# Patient Record
Sex: Female | Born: 1980 | Race: White | Hispanic: No | Marital: Single | Smoking: Never smoker
Health system: Southern US, Community
[De-identification: ages and names within clinical notes are randomized; demographics above are authoritative.]

## PROBLEM LIST (undated history)

## (undated) DIAGNOSIS — N189 Chronic kidney disease, unspecified: Secondary | ICD-10-CM

## (undated) DIAGNOSIS — N1832 Chronic kidney disease, stage 3b: Secondary | ICD-10-CM

## (undated) DIAGNOSIS — D582 Other hemoglobinopathies: Secondary | ICD-10-CM

## (undated) DIAGNOSIS — H2010 Chronic iridocyclitis, unspecified eye: Secondary | ICD-10-CM

## (undated) DIAGNOSIS — D751 Secondary polycythemia: Secondary | ICD-10-CM

## (undated) DIAGNOSIS — Q909 Down syndrome, unspecified: Secondary | ICD-10-CM

## (undated) DIAGNOSIS — E039 Hypothyroidism, unspecified: Secondary | ICD-10-CM

## (undated) DIAGNOSIS — L680 Hirsutism: Secondary | ICD-10-CM

## (undated) HISTORY — PX: ATRIOVENTRICULAR CANAL REPAIR, COMPLETE: SHX1200

## (undated) HISTORY — DX: Chronic iridocyclitis, unspecified eye: H20.10

## (undated) HISTORY — PX: CARDIAC SURGERY: SHX584

## (undated) HISTORY — DX: Chronic kidney disease, stage 3b: N18.32

## (undated) HISTORY — DX: Other hemoglobinopathies: D58.2

---

## 2021-03-06 ENCOUNTER — Other Ambulatory Visit: Payer: Self-pay | Admitting: Nephrology

## 2021-03-06 ENCOUNTER — Other Ambulatory Visit (HOSPITAL_COMMUNITY): Payer: Self-pay | Admitting: Nephrology

## 2021-03-06 DIAGNOSIS — N1832 Chronic kidney disease, stage 3b: Secondary | ICD-10-CM

## 2021-03-15 ENCOUNTER — Other Ambulatory Visit: Payer: Self-pay

## 2021-03-15 ENCOUNTER — Ambulatory Visit
Admission: RE | Admit: 2021-03-15 | Discharge: 2021-03-15 | Disposition: A | Discharge status: Home / Self Care | Payer: Medicare HMO | Source: Ambulatory Visit | Attending: Nephrology | Admitting: Nephrology

## 2021-03-15 DIAGNOSIS — N1832 Chronic kidney disease, stage 3b: Secondary | ICD-10-CM | POA: Diagnosis not present

## 2021-03-21 ENCOUNTER — Other Ambulatory Visit: Payer: Self-pay

## 2021-03-21 ENCOUNTER — Emergency Department
Admission: EM | Admit: 2021-03-21 | Discharge: 2021-03-21 | Disposition: A | Discharge status: Home / Self Care | Payer: Medicare HMO | Attending: Emergency Medicine | Admitting: Emergency Medicine

## 2021-03-21 ENCOUNTER — Encounter: Payer: Self-pay | Admitting: Emergency Medicine

## 2021-03-21 DIAGNOSIS — R319 Hematuria, unspecified: Secondary | ICD-10-CM | POA: Insufficient documentation

## 2021-03-21 DIAGNOSIS — R531 Weakness: Secondary | ICD-10-CM | POA: Diagnosis not present

## 2021-03-21 HISTORY — DX: Secondary polycythemia: D75.1

## 2021-03-21 HISTORY — DX: Chronic kidney disease, unspecified: N18.9

## 2021-03-21 HISTORY — DX: Hypothyroidism, unspecified: E03.9

## 2021-03-21 HISTORY — DX: Down syndrome, unspecified: Q90.9

## 2021-03-21 HISTORY — DX: Hirsutism: L68.0

## 2021-03-21 LAB — URINALYSIS, COMPLETE (UACMP) WITH MICROSCOPIC
Bacteria, UA: NONE SEEN
Bilirubin Urine: NEGATIVE
Glucose, UA: NEGATIVE mg/dL
Hgb urine dipstick: NEGATIVE
Ketones, ur: NEGATIVE mg/dL
Leukocytes,Ua: NEGATIVE
Nitrite: NEGATIVE
Protein, ur: NEGATIVE mg/dL
Specific Gravity, Urine: 1.002 — ABNORMAL LOW (ref 1.005–1.030)
WBC, UA: NONE SEEN WBC/hpf (ref 0–5)
pH: 6 (ref 5.0–8.0)

## 2021-03-21 LAB — COMPREHENSIVE METABOLIC PANEL
ALT: 18 U/L (ref 0–44)
AST: 30 U/L (ref 15–41)
Albumin: 3.1 g/dL — ABNORMAL LOW (ref 3.5–5.0)
Alkaline Phosphatase: 55 U/L (ref 38–126)
Anion gap: 11 (ref 5–15)
BUN: 17 mg/dL (ref 6–20)
CO2: 22 mmol/L (ref 22–32)
Calcium: 8.7 mg/dL — ABNORMAL LOW (ref 8.9–10.3)
Chloride: 99 mmol/L (ref 98–111)
Creatinine, Ser: 1.47 mg/dL — ABNORMAL HIGH (ref 0.44–1.00)
GFR, Estimated: 46 mL/min — ABNORMAL LOW (ref >60)
Glucose, Bld: 86 mg/dL (ref 70–99)
Potassium: 3.9 mmol/L (ref 3.5–5.1)
Sodium: 132 mmol/L — ABNORMAL LOW (ref 135–145)
Total Bilirubin: 1.1 mg/dL (ref 0.3–1.2)
Total Protein: 7.8 g/dL (ref 6.5–8.1)

## 2021-03-21 LAB — CBC
HCT: 45.8 % (ref 36.0–46.0)
Hemoglobin: 15.3 g/dL — ABNORMAL HIGH (ref 12.0–15.0)
MCH: 33.1 pg (ref 26.0–34.0)
MCHC: 33.4 g/dL (ref 30.0–36.0)
MCV: 99.1 fL (ref 80.0–100.0)
Platelets: 114 10*3/uL — ABNORMAL LOW (ref 150–400)
RBC: 4.62 MIL/uL (ref 3.87–5.11)
RDW: 13.1 % (ref 11.5–15.5)
WBC: 4.5 10*3/uL (ref 4.0–10.5)
nRBC: 0 % (ref 0.0–0.2)

## 2021-03-21 LAB — T4, FREE: Free T4: 1.64 ng/dL — ABNORMAL HIGH (ref 0.61–1.12)

## 2021-03-21 LAB — TSH: TSH: 14.066 u[IU]/mL — ABNORMAL HIGH (ref 0.350–4.500)

## 2021-03-21 MED ORDER — SODIUM CHLORIDE 0.9 % IV BOLUS
500.0000 mL | Freq: Once | INTRAVENOUS | Status: DC
Start: 2021-03-21 — End: 2021-03-22

## 2021-03-21 NOTE — ED Provider Notes (Signed)
? ?[facility] ?Provider Note ? ? ? Event Date/Time  ? First MD Initiated Contact with Patient 03/21/21 1708   ?  (approximate) ? ? ?History  ? ?Hematuria and Abnormal Lab ? ? ?HPI ? ?Sarah Barker is a 41 y.o. female  who, per nephrology noted dated earlier today was sent to the emergency department today because of concern for "the relative hypotension and out of character behavior we will refer the patient over to the emergency department for further evaluation as she had an unstable gait."  History is obtained from family at bedside. They state that the patient started having issues roughly 6 weeks ago. They have noticed she has had decreased energy and difficulty with gait. Per chart review it was noted on nephrology note dated 02/21/2021 she had already had multiple falls. Family is also concerned because they have noted some skin changes to her shins and forearms over the past month. Family does not have any acute complaints.  ? ? ?Physical Exam  ? ?Triage Vital Signs: ?ED Triage Vitals  ?Enc Vitals Group  ?   BP 03/21/21 1548 112/77  ?   Pulse Rate 03/21/21 1548 (!) 102  ?   Resp 03/21/21 1548 17  ?   Temp 03/21/21 1548 98.3 ?F (36.8 ?C)  ?   Temp Source 03/21/21 1548 Oral  ?   SpO2 03/21/21 1548 100 %  ?   Weight 03/21/21 1548 142 lb (64.4 kg)  ?   Height 03/21/21 1548 4\' 9"  (1.448 m)  ? ?Most recent vital signs: ?Vitals:  ? 03/21/21 1548  ?BP: 112/77  ?Pulse: (!) 102  ?Resp: 17  ?Temp: 98.3 ?F (36.8 ?C)  ?SpO2: 100%  ? ? ?General: Awake, no distress.  ?CV:  Good peripheral perfusion.  ?Resp:  Normal effort.  ?Abd:  No distention.  ?Skin:  Skin changes noted to bilateral shins. No warmth.  ? ? ?ED Results / Procedures / Treatments  ? ?Labs ?(all labs ordered are listed, but only abnormal results are displayed) ?Labs Reviewed  ?CBC - Abnormal; Notable for the following components:  ?    Result Value  ? Hemoglobin 15.3 (*)   ? Platelets 114 (*)   ? All other components within normal  limits  ?URINALYSIS, COMPLETE (UACMP) WITH MICROSCOPIC - Abnormal; Notable for the following components:  ? Color, Urine STRAW (*)   ? APPearance CLEAR (*)   ? Specific Gravity, Urine 1.002 (*)   ? All other components within normal limits  ?COMPREHENSIVE METABOLIC PANEL - Abnormal; Notable for the following components:  ? Sodium 132 (*)   ? Creatinine, Ser 1.47 (*)   ? Calcium 8.7 (*)   ? Albumin 3.1 (*)   ? GFR, Estimated 46 (*)   ? All other components within normal limits  ?TSH - Abnormal; Notable for the following components:  ? TSH 14.066 (*)   ? All other components within normal limits  ?T4, FREE - Abnormal; Notable for the following components:  ? Free T4 1.64 (*)   ? All other components within normal limits  ? ? ? ?EKG ? ?None ? ?RADIOLOGY ?None ? ? ?PROCEDURES: ? ?Critical Care performed: No ? ?Procedures ? ? ?MEDICATIONS ORDERED IN ED: ?Medications - No data to display ? ? ?IMPRESSION / MDM / ASSESSMENT AND PLAN / ED COURSE  ?I reviewed the triage vital signs and the nursing notes. ?             ?               ? ?  Differential diagnosis includes, but is not limited to, dehydration, thyroid disease, infection. ? ?Patient presents to the emergency department today at the advice of nephrology because of concerns for low blood pressure and weakness.  It does sound like this weakness has been ongoing for the past 6 weeks.  Blood work today without any concerning leukocytosis.  Patient does have a slight elevation of her creatinine however already follows with nephrology.  Did write for IV fluids however family opted for oral hydration instead.  I did check thyroid studies and a somewhat mixed picture with elevated T4 and elevated TSH.  I do however wonder if patient is somewhat hypothyroid given that many of her symptoms would fit with this.  I did advise patient's family to contact her endocrinologist for possible medication adjustment.   ? ? ?FINAL CLINICAL IMPRESSION(S) / ED DIAGNOSES  ? ?Final diagnoses:   ?Weakness  ? ? ?Note:  This document was prepared using Dragon voice recognition software and may include unintentional dictation errors. ? ?  ?Phineas Semen, MD ?03/21/21 2300 ? ?

## 2021-03-21 NOTE — ED Triage Notes (Signed)
Pt via POV from home. Pt here for kidney issues. Per family, pt has been seeing a kidney doctor since January. Per family, pt health has been decreasing for the past 6 months.  ?Pt has a hx of Down Syndrome.  ?

## 2021-03-21 NOTE — Discharge Instructions (Signed)
Please seek medical attention for any high fevers, chest pain, shortness of breath, change in behavior, persistent vomiting, bloody stool or any other new or concerning symptoms.  

## 2021-03-21 NOTE — ED Notes (Signed)
Family knows need for ua  ? ?

## 2021-03-21 NOTE — ED Notes (Signed)
Mother gave verbal consent for DC ? ?

## 2021-03-21 NOTE — ED Notes (Signed)
Mother asked if pt could eat and drink instead. Confirmed with Derrill Kay to hold off on IV fluids  ? ?

## 2021-03-21 NOTE — ED Notes (Signed)
According to mother pt has no been acting herself for about 6 weeks. Mother states they were sent here today due to low BP and hemoglobin being too high ?  ?

## 2021-04-03 ENCOUNTER — Encounter: Payer: Self-pay | Admitting: *Deleted

## 2021-04-15 ENCOUNTER — Inpatient Hospital Stay: Payer: Medicare HMO

## 2021-04-15 ENCOUNTER — Inpatient Hospital Stay: Payer: Medicare HMO | Attending: Oncology | Admitting: Oncology

## 2021-04-15 ENCOUNTER — Encounter: Payer: Self-pay | Admitting: Oncology

## 2021-04-15 VITALS — BP 106/72 | HR 102 | Temp 96.4°F | Ht <= 58 in | Wt 140.0 lb

## 2021-04-15 DIAGNOSIS — D751 Secondary polycythemia: Secondary | ICD-10-CM

## 2021-04-15 DIAGNOSIS — N189 Chronic kidney disease, unspecified: Secondary | ICD-10-CM | POA: Insufficient documentation

## 2021-04-15 DIAGNOSIS — D696 Thrombocytopenia, unspecified: Secondary | ICD-10-CM

## 2021-04-15 DIAGNOSIS — Q909 Down syndrome, unspecified: Secondary | ICD-10-CM | POA: Diagnosis not present

## 2021-04-15 DIAGNOSIS — R229 Localized swelling, mass and lump, unspecified: Secondary | ICD-10-CM

## 2021-04-15 LAB — CBC WITH DIFFERENTIAL/PLATELET
Abs Immature Granulocytes: 0.01 K/uL (ref 0.00–0.07)
Basophils Absolute: 0.1 K/uL (ref 0.0–0.1)
Basophils Relative: 1 %
Eosinophils Absolute: 0.1 K/uL (ref 0.0–0.5)
Eosinophils Relative: 2 %
HCT: 46 % (ref 36.0–46.0)
Hemoglobin: 15.5 g/dL — ABNORMAL HIGH (ref 12.0–15.0)
Immature Granulocytes: 0 %
Lymphocytes Relative: 19 %
Lymphs Abs: 0.8 K/uL (ref 0.7–4.0)
MCH: 34 pg (ref 26.0–34.0)
MCHC: 33.7 g/dL (ref 30.0–36.0)
MCV: 100.9 fL — ABNORMAL HIGH (ref 80.0–100.0)
Monocytes Absolute: 0.6 K/uL (ref 0.1–1.0)
Monocytes Relative: 13 %
Neutro Abs: 2.7 K/uL (ref 1.7–7.7)
Neutrophils Relative %: 65 %
Platelets: 142 K/uL — ABNORMAL LOW (ref 150–400)
RBC: 4.56 MIL/uL (ref 3.87–5.11)
RDW: 13.9 % (ref 11.5–15.5)
WBC: 4.2 K/uL (ref 4.0–10.5)
nRBC: 0 % (ref 0.0–0.2)

## 2021-04-15 LAB — LACTATE DEHYDROGENASE: LDH: 137 U/L (ref 98–192)

## 2021-04-15 NOTE — Progress Notes (Signed)
?Hematology/Oncology Consult note ?[phone] [phone] ?  ? ?   ? ? ?Patient Care Team: ?Anthonette Legato, MD as PCP - General (Nephrology) ?Earlie Server, MD as Consulting Physician (Hematology) ? ?REFERRING PROVIDER: ?Anthonette Legato, MD  ?CHIEF COMPLAINTS/REASON FOR VISIT:  ?Evaluation of elevated hemoglobin ? ?HISTORY OF PRESENTING ILLNESS:  ? ?Sarah Barker is a  41 y.o.  female with PMH listed below was seen in consultation at the request of  Holley Raring, Munsoor, MD  for evaluation of elevated hemoglobin ? ?Patient was accompanied by her mother.  Patient has Down syndrome. ?Mother has noticed decreased appetite recently and also erythematous rash on bilateral upper and lower extremities.  Patient has chronic kidney disease and follows up with Dr. Holley Raring. ?Nephrology work-up negative for ANA, ANCA antibodies, and GBM antibodies, SPEP and UPEP ? ? ?Review of Systems  ?Unable to perform ROS: Other (Down syndrome)  ? ?MEDICAL HISTORY:  ?Past Medical History:  ?Diagnosis Date  ? CKD (chronic kidney disease)   ? Down syndrome   ? Elevated hemoglobin (HCC)   ? Erythrocytosis   ? Erythrocytosis   ? Granulomatous uveitis   ? Hirsutism   ? Hypothyroid   ? Stage 3b chronic kidney disease (CKD) ([place])   ? ? ?SURGICAL HISTORY: ?Past Surgical History:  ?Procedure Laterality Date  ? ATRIOVENTRICULAR CANAL REPAIR, COMPLETE    ? CARDIAC SURGERY    ? ? ?SOCIAL HISTORY: ?Social History  ? ?Socioeconomic History  ? Marital status: Single  ?  Spouse name: Not on file  ? Number of children: Not on file  ? Years of education: Not on file  ? Highest education level: Not on file  ?Occupational History  ? Not on file  ?Tobacco Use  ? Smoking status: Never  ? Smokeless tobacco: Never  ?Substance and Sexual Activity  ? Alcohol use: Not on file  ? Drug use: Not on file  ? Sexual activity: Not on file  ?Other Topics Concern  ? Not on file  ?Social History Narrative  ? Not on file  ? ?Social Determinants of Health   ? ?Financial Resource Strain: Not on file  ?Food Insecurity: Not on file  ?Transportation Needs: Not on file  ?Physical Activity: Not on file  ?Stress: Not on file  ?Social Connections: Not on file  ?Intimate Partner Violence: Not on file  ? ? ?FAMILY HISTORY: ?History reviewed. No pertinent family history. ? ?ALLERGIES:  has No Known Allergies. ? ?MEDICATIONS:  ?Current Outpatient Medications  ?Medication Sig Dispense Refill  ? levothyroxine (SYNTHROID) 88 MCG tablet Take by mouth.    ? prednisoLONE acetate (PRED FORTE) 1 % ophthalmic suspension Apply to eye.    ? valACYclovir (VALTREX) 1000 MG tablet Take by mouth.    ? ?No current facility-administered medications for this visit.  ? ? ? ?PHYSICAL EXAMINATION: ? ?Vitals:  ? 04/15/21 1501  ?BP: 106/72  ?Pulse: (!) 102  ?Temp: (!) 96.4 ?F (35.8 ?C)  ? ?Filed Weights  ? 04/15/21 1501  ?Weight: 140 lb (63.5 kg)  ? ? ?Physical Exam ?Constitutional:   ?   General: She is not in acute distress. ?HENT:  ?   Head: Normocephalic and atraumatic.  ?Eyes:  ?   General: No scleral icterus. ?Cardiovascular:  ?   Rate and Rhythm: Normal rate and regular rhythm.  ?   Heart sounds: Normal heart sounds.  ?Pulmonary:  ?   Effort: Pulmonary effort is normal. No respiratory distress.  ?   Breath sounds: No  wheezing.  ?Abdominal:  ?   General: Bowel sounds are normal. There is no distension.  ?   Palpations: Abdomen is soft.  ?Musculoskeletal:     ?   General: No deformity. Normal range of motion.  ?   Cervical back: Normal range of motion and neck supple.  ?Skin: ?   General: Skin is warm and dry.  ?   Comments: Erythematous skin nodules on upper and lower extremities.  ?Neurological:  ?   Mental Status: She is alert. Mental status is at baseline.  ? ? ?LABORATORY DATA:  ?I have reviewed the data as listed ?Lab Results  ?Component Value Date  ? WBC 4.2 04/15/2021  ? HGB 15.5 (H) 04/15/2021  ? HCT 46.0 04/15/2021  ? MCV 100.9 (H) 04/15/2021  ? PLT 142 (L) 04/15/2021  ? ?Recent Labs  ?   03/21/21 ?1552  ?NA 132*  ?K 3.9  ?CL 99  ?CO2 22  ?GLUCOSE 86  ?BUN 17  ?CREATININE 1.47*  ?CALCIUM 8.7*  ?GFRNONAA 46*  ?PROT 7.8  ?ALBUMIN 3.1*  ?AST 30  ?ALT 18  ?ALKPHOS 55  ?BILITOT 1.1  ? ?Iron/TIBC/Ferritin/ %Sat ?No results found for: IRON, TIBC, FERRITIN, IRONPCTSAT  ? ? ?RADIOGRAPHIC STUDIES: ?I have personally reviewed the radiological images as listed and agreed with the findings in the report. ?No results found. ? ? ? ?ASSESSMENT & PLAN:  ?1. Erythrocytosis   ?2. Skin nodule   ? ?Check CBC, BCR ABL1 FISH,Carbon monoxide level, Erythropoietin, LDH ?If work-up for primary erythrocytosis negative, patient will need to work-up for secondary erythrocytosis, i.e.Sleep study ? ?Thrombocytopenia, mild. ? ?Skin nodules on extremities.  Unknown etiology.He has had extensive work-up with Nephrologist ?I recommend patient to get dermatology evaluation and have a biopsy. ? ?Orders Placed This Encounter  ?Procedures  ? CBC with Differential/Platelet  ?  Standing Status:   Future  ?  Number of Occurrences:   1  ?  Standing Expiration Date:   04/16/2022  ? JAK2 V617F, w Reflex to CALR/E12/MPL  ?  Standing Status:   Future  ?  Number of Occurrences:   1  ?  Standing Expiration Date:   04/16/2022  ? BCR-ABL1 FISH  ?  Standing Status:   Future  ?  Number of Occurrences:   1  ?  Standing Expiration Date:   04/16/2022  ? Carbon monoxide, blood (performed at ref lab)  ?  Standing Status:   Future  ?  Number of Occurrences:   1  ?  Standing Expiration Date:   04/16/2022  ? Erythropoietin  ?  Standing Status:   Future  ?  Number of Occurrences:   1  ?  Standing Expiration Date:   04/16/2022  ? Lactate dehydrogenase  ?  Standing Status:   Future  ?  Number of Occurrences:   1  ?  Standing Expiration Date:   04/16/2022  ?  ?All questions were answered. The patient knows to call the clinic with any problems questions or concerns. ? ?cc ?Anthonette Legato, MD  ? ? ?Return of visit: 3-4 weeks to review results.  ?Thank you for this kind  referral and the opportunity to participate in the care of this patient. A copy of today's note is routed to referring provider  ? ?Earlie Server, MD, PhD ?[facility] Hematology Oncology ?04/15/2021 ? ? ?

## 2021-04-16 LAB — ERYTHROPOIETIN: Erythropoietin: 9.9 m[IU]/mL (ref 2.6–18.5)

## 2021-04-16 LAB — CARBON MONOXIDE, BLOOD (PERFORMED AT REF LAB): Carbon Monoxide, Blood: 2.8 % (ref 0.0–3.6)

## 2021-04-18 LAB — BCR-ABL1 FISH
Cells Analyzed: 200
Cells Counted: 200

## 2021-04-24 LAB — JAK2 V617F, REFLEX TO E12-15: Reflex: 15

## 2021-04-24 LAB — E12 - E15 (REFLEXED)

## 2021-05-08 ENCOUNTER — Encounter: Payer: Self-pay | Admitting: Nephrology

## 2021-05-09 ENCOUNTER — Other Ambulatory Visit: Payer: Self-pay | Admitting: Rheumatology

## 2021-05-09 ENCOUNTER — Ambulatory Visit
Admission: RE | Admit: 2021-05-09 | Discharge: 2021-05-09 | Disposition: A | Discharge status: Home / Self Care | Payer: Medicare HMO | Source: Ambulatory Visit | Attending: Rheumatology | Admitting: Rheumatology

## 2021-05-09 DIAGNOSIS — R0602 Shortness of breath: Secondary | ICD-10-CM

## 2021-05-13 ENCOUNTER — Encounter: Payer: Self-pay | Admitting: Oncology

## 2021-05-13 ENCOUNTER — Inpatient Hospital Stay: Payer: Medicare HMO | Attending: Oncology | Admitting: Oncology

## 2021-05-13 ENCOUNTER — Inpatient Hospital Stay: Payer: Medicare HMO

## 2021-05-13 VITALS — BP 112/75 | HR 102 | Temp 96.1°F | Wt 137.0 lb

## 2021-05-13 DIAGNOSIS — R229 Localized swelling, mass and lump, unspecified: Secondary | ICD-10-CM | POA: Insufficient documentation

## 2021-05-13 DIAGNOSIS — N1832 Chronic kidney disease, stage 3b: Secondary | ICD-10-CM | POA: Diagnosis not present

## 2021-05-13 DIAGNOSIS — D751 Secondary polycythemia: Secondary | ICD-10-CM | POA: Diagnosis not present

## 2021-05-13 DIAGNOSIS — Q909 Down syndrome, unspecified: Secondary | ICD-10-CM | POA: Insufficient documentation

## 2021-05-13 DIAGNOSIS — R718 Other abnormality of red blood cells: Secondary | ICD-10-CM | POA: Diagnosis present

## 2021-05-13 NOTE — Progress Notes (Signed)
?Hematology/Oncology Consult note ?Telephone:(336) [phone] [phone] ?  ? ?   ? ? ?Patient Care Team: ?Anthonette Legato, MD as PCP - General (Nephrology) ?Earlie Server, MD as Consulting Physician (Hematology) ? ?REFERRING PROVIDER: ?Anthonette Legato, MD  ?CHIEF COMPLAINTS/REASON FOR VISIT:  ?Follow up for erythrocytosis. ? ?HISTORY OF PRESENTING ILLNESS:  ? ?Sarah Barker is a  41 y.o.  female with PMH listed below was seen in consultation at the request of  Holley Raring, Munsoor, MD  for evaluation of elevated hemoglobin ? ?Patient was accompanied by her mother.  Patient has Down syndrome. ?Mother has noticed decreased appetite recently and also erythematous rash on bilateral upper and lower extremities.  Patient has chronic kidney disease and follows up with Dr. Holley Raring. ?Nephrology work-up negative for ANA, ANCA antibodies, and GBM antibodies, SPEP and UPEP ? ?INTERVAL HISTORY ?Sarah Barker is a 41 y.o. female who has above history reviewed by me today presents for follow up visit to review results. ?Patient was accompanied by her mother.   ? ? ?Review of Systems  ?Unable to perform ROS: Other (Down syndrome)  ? ?MEDICAL HISTORY:  ?Past Medical History:  ?Diagnosis Date  ? CKD (chronic kidney disease)   ? Down syndrome   ? Elevated hemoglobin (HCC)   ? Erythrocytosis   ? Erythrocytosis   ? Granulomatous uveitis   ? Hirsutism   ? Hypothyroid   ? Stage 3b chronic kidney disease (CKD) ([place])   ? ? ?SURGICAL HISTORY: ?Past Surgical History:  ?Procedure Laterality Date  ? ATRIOVENTRICULAR CANAL REPAIR, COMPLETE    ? CARDIAC SURGERY    ? ? ?SOCIAL HISTORY: ?Social History  ? ?Socioeconomic History  ? Marital status: Single  ?  Spouse name: Not on file  ? Number of children: Not on file  ? Years of education: Not on file  ? Highest education level: Not on file  ?Occupational History  ? Not on file  ?Tobacco Use  ? Smoking status: Never  ? Smokeless tobacco: Never  ?Substance and Sexual Activity  ? Alcohol use: Not  on file  ? Drug use: Not on file  ? Sexual activity: Not on file  ?Other Topics Concern  ? Not on file  ?Social History Narrative  ? Not on file  ? ?Social Determinants of Health  ? ?Financial Resource Strain: Not on file  ?Food Insecurity: Not on file  ?Transportation Needs: Not on file  ?Physical Activity: Not on file  ?Stress: Not on file  ?Social Connections: Not on file  ?Intimate Partner Violence: Not on file  ? ? ?FAMILY HISTORY: ?No family history on file. ? ?ALLERGIES:  has No Known Allergies. ? ?MEDICATIONS:  ?Current Outpatient Medications  ?Medication Sig Dispense Refill  ? levothyroxine (SYNTHROID) 88 MCG tablet Take by mouth.    ? prednisoLONE acetate (PRED FORTE) 1 % ophthalmic suspension Apply to eye.    ? valACYclovir (VALTREX) 1000 MG tablet Take by mouth.    ? ?No current facility-administered medications for this visit.  ? ? ? ?PHYSICAL EXAMINATION: ? ?Vitals:  ? 05/13/21 1351  ?BP: 112/75  ?Pulse: (!) 102  ?Temp: (!) 96.1 ?F (35.6 ?C)  ? ?Filed Weights  ? 05/13/21 1351  ?Weight: 137 lb (62.1 kg)  ? ? ?Physical Exam ?Constitutional:   ?   General: She is not in acute distress. ?HENT:  ?   Head: Normocephalic and atraumatic.  ?Eyes:  ?   General: No scleral icterus. ?Cardiovascular:  ?   Rate and Rhythm: Normal rate  and regular rhythm.  ?   Heart sounds: Normal heart sounds.  ?Pulmonary:  ?   Effort: Pulmonary effort is normal. No respiratory distress.  ?   Breath sounds: No wheezing.  ?Abdominal:  ?   General: Bowel sounds are normal. There is no distension.  ?   Palpations: Abdomen is soft.  ?Musculoskeletal:     ?   General: No deformity. Normal range of motion.  ?   Cervical back: Normal range of motion and neck supple.  ?Skin: ?   General: Skin is warm and dry.  ?   Comments: Erythematous skin nodules on upper and lower extremities.  ?Neurological:  ?   Mental Status: She is alert. Mental status is at baseline.  ? ? ?LABORATORY DATA:  ?I have reviewed the data as listed ?Lab Results   ?Component Value Date  ? WBC 4.2 04/15/2021  ? HGB 15.5 (H) 04/15/2021  ? HCT 46.0 04/15/2021  ? MCV 100.9 (H) 04/15/2021  ? PLT 142 (L) 04/15/2021  ? ?Recent Labs  ?  03/21/21 ?1552  ?NA 132*  ?K 3.9  ?CL 99  ?CO2 22  ?GLUCOSE 86  ?BUN 17  ?CREATININE 1.47*  ?CALCIUM 8.7*  ?GFRNONAA 46*  ?PROT 7.8  ?ALBUMIN 3.1*  ?AST 30  ?ALT 18  ?ALKPHOS 55  ?BILITOT 1.1  ? ? ?Iron/TIBC/Ferritin/ %Sat ?No results found for: IRON, TIBC, FERRITIN, IRONPCTSAT  ? ? ?RADIOGRAPHIC STUDIES: ?I have personally reviewed the radiological images as listed and agreed with the findings in the report. ?CT CHEST WO CONTRAST ? ?Result Date: 05/09/2021 ?CLINICAL DATA:  Shortness of breath EXAM: CT CHEST WITHOUT CONTRAST TECHNIQUE: Multidetector CT imaging of the chest was performed following the standard protocol without IV contrast. RADIATION DOSE REDUCTION: This exam was performed according to the departmental dose-optimization program which includes automated exposure control, adjustment of the mA and/or kV according to patient size and/or use of iterative reconstruction technique. COMPARISON:  None. FINDINGS: Cardiovascular: Normal heart size. No pericardial effusion. Normal caliber thoracic aorta with no evidence of atherosclerotic disease. Hyperdense linear material seen at the area of the intraventricular septum, likely due to prior AV canal repair. Mediastinum/Nodes: Esophagus is unremarkable. Thyroid is unremarkable. Enlarged mediastinal and hilar lymph nodes reference AP window lymph node measuring 1.0 cm in short axis on series 2, image 41. Lungs/Pleura: Central airways are patent. Bilateral mosaic attenuation. Bilateral ground-glass opacities are likely due to atelectasis given expiratory phase of imaging. No consolidation, pleural effusion or pneumothorax. Upper Abdomen: No acute abnormality. Musculoskeletal: Prior median sternotomy with intact sternal wires. No chest wall mass or suspicious bone lesions identified. IMPRESSION: 1.  Bilateral mosaic attenuation, likely due to air trapping which can be seen in the setting of small airways disease. 2. Bilateral ground-glass opacities are likely due to atelectasis given expiratory phase of imaging. 3. Mildly enlarged mediastinal and hilar lymph nodes, likely reactive. Electronically Signed   By: Yetta Glassman M.D.   On: 05/09/2021 16:35   ? ? ? ?ASSESSMENT & PLAN:  ?1. Erythrocytosis   ?2. Skin nodule   ? ?#Erythrocytosis, likely secondary. ?Labs reviewed and discussed with patient.  JAK2 V6 1 7 F-, exon 12-15 negative.  BCR ABL 1 FISH negative, carbon monoxide level normal, erythropoietin level normal, LDH normal. ?Plan we will check CALR mutation and MPL mutation.  If negative, then it is less likely that she has primary erythrocytosis. ?Patient is at risk of developing sleep apnea.  I recommend patient's mother to further discuss with  primary care provider for evaluation of sleep apnea. ? ? ?#Skin nodule, will need to establish care with dermatology for biopsy. ? ?Orders Placed This Encounter  ?Procedures  ? MPL mutation analysis  ?  Standing Status:   Future  ?  Number of Occurrences:   1  ?  Standing Expiration Date:   05/14/2022  ? Miscellaneous [facility] test (send-out)  ?  Standing Status:   Future  ?  Number of Occurrences:   1  ?  Standing Expiration Date:   05/14/2022  ?  Order Specific Question:   Test name / description:  ?  Answer:   CALR mutation- test [phone]  ?  ?All questions were answered. The patient knows to call the clinic with any problems questions or concerns. ? ?cc ?Anthonette Legato, MD  ? ? ?Return of visit: 6 months ? ?Earlie Server, MD, PhD ?[facility] Hematology Oncology ?05/13/2021 ? ? ?

## 2021-05-17 LAB — MPL MUTATION ANALYSIS

## 2021-05-20 LAB — MISC LABCORP TEST (SEND OUT): Labcorp test code: 489450

## 2021-11-13 ENCOUNTER — Ambulatory Visit: Payer: Medicare HMO | Admitting: Oncology

## 2021-11-13 ENCOUNTER — Other Ambulatory Visit: Payer: Medicare HMO

## 2023-01-02 ENCOUNTER — Encounter: Payer: Self-pay | Admitting: Rheumatology

## 2023-04-06 ENCOUNTER — Emergency Department

## 2023-04-06 ENCOUNTER — Encounter: Payer: Self-pay | Admitting: Emergency Medicine

## 2023-04-06 ENCOUNTER — Observation Stay
Admission: EM | Admit: 2023-04-06 | Discharge: 2023-04-07 | Disposition: A | Discharge status: Home / Self Care | Attending: Internal Medicine | Admitting: Internal Medicine

## 2023-04-06 DIAGNOSIS — Z1152 Encounter for screening for COVID-19: Secondary | ICD-10-CM | POA: Diagnosis not present

## 2023-04-06 DIAGNOSIS — E039 Hypothyroidism, unspecified: Secondary | ICD-10-CM | POA: Insufficient documentation

## 2023-04-06 DIAGNOSIS — R471 Dysarthria and anarthria: Secondary | ICD-10-CM | POA: Insufficient documentation

## 2023-04-06 DIAGNOSIS — R4182 Altered mental status, unspecified: Principal | ICD-10-CM

## 2023-04-06 DIAGNOSIS — Q909 Down syndrome, unspecified: Secondary | ICD-10-CM | POA: Insufficient documentation

## 2023-04-06 DIAGNOSIS — N1832 Chronic kidney disease, stage 3b: Secondary | ICD-10-CM | POA: Diagnosis not present

## 2023-04-06 DIAGNOSIS — Z79899 Other long term (current) drug therapy: Secondary | ICD-10-CM | POA: Insufficient documentation

## 2023-04-06 DIAGNOSIS — R569 Unspecified convulsions: Secondary | ICD-10-CM | POA: Diagnosis not present

## 2023-04-06 DIAGNOSIS — R531 Weakness: Secondary | ICD-10-CM | POA: Diagnosis not present

## 2023-04-06 DIAGNOSIS — G9340 Encephalopathy, unspecified: Secondary | ICD-10-CM | POA: Diagnosis not present

## 2023-04-06 DIAGNOSIS — D696 Thrombocytopenia, unspecified: Secondary | ICD-10-CM | POA: Insufficient documentation

## 2023-04-06 DIAGNOSIS — R1310 Dysphagia, unspecified: Secondary | ICD-10-CM | POA: Diagnosis not present

## 2023-04-06 DIAGNOSIS — Q859 Phakomatosis, unspecified: Secondary | ICD-10-CM

## 2023-04-06 LAB — BLOOD GAS, VENOUS
Acid-Base Excess: 1.2 mmol/L (ref 0.0–2.0)
Bicarbonate: 27.6 mmol/L (ref 20.0–28.0)
O2 Saturation: 70.7 %
Patient temperature: 37
pCO2, Ven: 50 mmHg (ref 44–60)
pH, Ven: 7.35 (ref 7.25–7.43)
pO2, Ven: 39 mmHg (ref 32–45)

## 2023-04-06 LAB — COMPREHENSIVE METABOLIC PANEL
ALT: 20 U/L (ref 0–44)
AST: 38 U/L (ref 15–41)
Albumin: 2.9 g/dL — ABNORMAL LOW (ref 3.5–5.0)
Alkaline Phosphatase: 75 U/L (ref 38–126)
Anion gap: 7 (ref 5–15)
BUN: 15 mg/dL (ref 6–20)
CO2: 27 mmol/L (ref 22–32)
Calcium: 8.6 mg/dL — ABNORMAL LOW (ref 8.9–10.3)
Chloride: 105 mmol/L (ref 98–111)
Creatinine, Ser: 1.3 mg/dL — ABNORMAL HIGH (ref 0.44–1.00)
GFR, Estimated: 53 mL/min — ABNORMAL LOW (ref >60)
Glucose, Bld: 154 mg/dL — ABNORMAL HIGH (ref 70–99)
Potassium: 3.6 mmol/L (ref 3.5–5.1)
Sodium: 139 mmol/L (ref 135–145)
Total Bilirubin: 0.8 mg/dL (ref 0.0–1.2)
Total Protein: 7.5 g/dL (ref 6.5–8.1)

## 2023-04-06 LAB — DIFFERENTIAL
Abs Immature Granulocytes: 0.01 10*3/uL (ref 0.00–0.07)
Basophils Absolute: 0.1 10*3/uL (ref 0.0–0.1)
Basophils Relative: 2 %
Eosinophils Absolute: 0.1 10*3/uL (ref 0.0–0.5)
Eosinophils Relative: 4 %
Immature Granulocytes: 0 %
Lymphocytes Relative: 12 %
Lymphs Abs: 0.5 10*3/uL — ABNORMAL LOW (ref 0.7–4.0)
Monocytes Absolute: 0.7 10*3/uL (ref 0.1–1.0)
Monocytes Relative: 18 %
Neutro Abs: 2.6 10*3/uL (ref 1.7–7.7)
Neutrophils Relative %: 64 %
Smear Review: NORMAL
WBC Morphology: INCREASED

## 2023-04-06 LAB — URINALYSIS, ROUTINE W REFLEX MICROSCOPIC
Bacteria, UA: NONE SEEN
Bilirubin Urine: NEGATIVE
Glucose, UA: NEGATIVE mg/dL
Ketones, ur: NEGATIVE mg/dL
Nitrite: NEGATIVE
Protein, ur: NEGATIVE mg/dL
Specific Gravity, Urine: >1.046 — ABNORMAL HIGH (ref 1.005–1.030)
pH: 6 (ref 5.0–8.0)

## 2023-04-06 LAB — CBC
HCT: 43.9 % (ref 36.0–46.0)
Hemoglobin: 15.1 g/dL — ABNORMAL HIGH (ref 12.0–15.0)
MCH: 31.2 pg (ref 26.0–34.0)
MCHC: 34.4 g/dL (ref 30.0–36.0)
MCV: 90.7 fL (ref 80.0–100.0)
Platelets: 95 10*3/uL — ABNORMAL LOW (ref 150–400)
RBC: 4.84 MIL/uL (ref 3.87–5.11)
RDW: 17 % — ABNORMAL HIGH (ref 11.5–15.5)
WBC: 4 10*3/uL (ref 4.0–10.5)
nRBC: 0 % (ref 0.0–0.2)

## 2023-04-06 LAB — RESP PANEL BY RT-PCR (RSV, FLU A&B, COVID)  RVPGX2
Influenza A by PCR: NEGATIVE
Influenza B by PCR: NEGATIVE
Resp Syncytial Virus by PCR: NEGATIVE
SARS Coronavirus 2 by RT PCR: NEGATIVE

## 2023-04-06 LAB — ETHANOL: Alcohol, Ethyl (B): <10 mg/dL (ref <10)

## 2023-04-06 LAB — PROTIME-INR
INR: 1.2 (ref 0.8–1.2)
Prothrombin Time: 15.2 s (ref 11.4–15.2)

## 2023-04-06 LAB — TSH: TSH: 7.665 u[IU]/mL — ABNORMAL HIGH (ref 0.350–4.500)

## 2023-04-06 LAB — CBG MONITORING, ED: Glucose-Capillary: 123 mg/dL — ABNORMAL HIGH (ref 70–99)

## 2023-04-06 LAB — APTT: aPTT: 28 s (ref 24–36)

## 2023-04-06 LAB — POC URINE PREG, ED: Preg Test, Ur: NEGATIVE

## 2023-04-06 MED ORDER — ORAL CARE MOUTH RINSE
15.0000 mL | OROMUCOSAL | Status: DC
  Administered 2023-04-06: 15 mL via OROMUCOSAL
  Filled 2023-04-06 (×6): qty 15

## 2023-04-06 MED ORDER — IOHEXOL 350 MG/ML SOLN
75.0000 mL | Freq: Once | INTRAVENOUS | Status: AC | PRN
  Administered 2023-04-06: 75 mL via INTRAVENOUS

## 2023-04-06 MED ORDER — ONDANSETRON HCL 4 MG PO TABS: 4.0000 mg | ORAL_TABLET | Freq: Four times a day (QID) | ORAL | Status: DC | PRN

## 2023-04-06 MED ORDER — SODIUM CHLORIDE 0.9 % IV BOLUS
1000.0000 mL | Freq: Once | INTRAVENOUS | Status: AC
  Administered 2023-04-06: 1000 mL via INTRAVENOUS

## 2023-04-06 MED ORDER — LEVOTHYROXINE SODIUM 88 MCG PO TABS: 88.0000 ug | ORAL_TABLET | Freq: Every day | ORAL | Status: DC

## 2023-04-06 MED ORDER — SODIUM CHLORIDE 0.9% FLUSH
3.0000 mL | Freq: Two times a day (BID) | INTRAVENOUS | Status: DC
  Administered 2023-04-06: 3 mL via INTRAVENOUS

## 2023-04-06 MED ORDER — SODIUM CHLORIDE 0.9% FLUSH: 3.0000 mL | Freq: Once | INTRAVENOUS | Status: DC

## 2023-04-06 MED ORDER — ONDANSETRON HCL 4 MG/2ML IJ SOLN: 4.0000 mg | Freq: Four times a day (QID) | INTRAMUSCULAR | Status: DC | PRN

## 2023-04-06 MED ORDER — ACETAMINOPHEN 325 MG PO TABS: 650.0000 mg | ORAL_TABLET | ORAL | Status: DC | PRN

## 2023-04-06 MED ORDER — ORAL CARE MOUTH RINSE: 15.0000 mL | OROMUCOSAL | Status: DC | PRN

## 2023-04-06 MED ORDER — LORAZEPAM 2 MG/ML IJ SOLN
0.5000 mg | Freq: Once | INTRAMUSCULAR | Status: AC
  Administered 2023-04-06: 0.5 mg via INTRAVENOUS
  Filled 2023-04-06: qty 1

## 2023-04-06 MED ORDER — SODIUM CHLORIDE 0.9 % IV SOLN: INTRAVENOUS | Status: DC

## 2023-04-06 MED ORDER — GADOBUTROL 1 MMOL/ML IV SOLN
7.0000 mL | Freq: Once | INTRAVENOUS | Status: AC | PRN
  Administered 2023-04-06: 7 mL via INTRAVENOUS

## 2023-04-06 MED ORDER — ACETAMINOPHEN 650 MG RE SUPP: 650.0000 mg | RECTAL | Status: DC | PRN

## 2023-04-06 MED ORDER — SODIUM CHLORIDE 0.9% FLUSH: 3.0000 mL | INTRAVENOUS | Status: DC | PRN

## 2023-04-06 MED ORDER — LORAZEPAM 2 MG/ML IJ SOLN: 4.0000 mg | INTRAMUSCULAR | Status: DC | PRN

## 2023-04-06 NOTE — ED Triage Notes (Signed)
Dr. Mumma at bedside to assess pt.

## 2023-04-06 NOTE — ED Notes (Signed)
 Iv fluids infusing  family with pt.

## 2023-04-06 NOTE — ED Notes (Signed)
 Pt is in CT. Neurologist is at the bedside, EDP also in CT with pt

## 2023-04-06 NOTE — ED Provider Notes (Signed)
 [facility] Provider Note    Event Date/Time   First MD Initiated Contact with Patient 04/06/23 1034     (approximate)   History   Aphasia (/)   HPI  Sarah Barker is a 43 y.o. female past medical history significant for Down syndrome, thyroid disorder, who presents to the emergency department with change of speech, difficulty swallowing and leg weakness.  History is provided by the patient's mother, [name], at bedside.  Patient's mother states that she woke up this morning around 830 and was in her normal state of health, walking around and singing.  States that she heard her choking and coughing and found her having difficulty swallowing a vagal.  Noted that she was having slurring of speech and difficulty standing.  Patient's last known well at 845.  No recent falls or head trauma.  Not on anticoagulation.  Normal state of health this morning and last night.  No new medication changes.  States that she has a known rash to her lower legs.  No history of CVA.     Physical Exam   Triage Vital Signs: ED Triage Vitals  Encounter Vitals Group     BP 04/06/23 1027 96/69     Systolic BP Percentile --      Diastolic BP Percentile --      Pulse Rate 04/06/23 1027 99     Resp 04/06/23 1027 17     Temp --      Temp src --      SpO2 04/06/23 1027 98 %     Weight 04/06/23 1030 158 lb (71.7 kg)     Height --      Head Circumference --      Peak Flow --      Pain Score --      Pain Loc --      Pain Education --      Exclude from Growth Chart --     Most recent vital signs: Vitals:   04/06/23 1300 04/06/23 1330  BP: 103/66 108/64  Pulse: 95 87  Resp: 16 (!) 29  SpO2: 100% 96%    Physical Exam Constitutional:      Appearance: She is well-developed.     Comments: Somnolent but easily arousable  HENT:     Head: Atraumatic.     Mouth/Throat:     Mouth: Mucous membranes are moist.  Eyes:     Extraocular Movements: Extraocular movements intact.      Conjunctiva/sclera: Conjunctivae normal.     Pupils: Pupils are equal, round, and reactive to light.  Cardiovascular:     Rate and Rhythm: Regular rhythm.     Pulses: Normal pulses.  Pulmonary:     Effort: No respiratory distress.     Breath sounds: No wheezing.  Abdominal:     General: There is no distension.     Tenderness: There is no abdominal tenderness.  Musculoskeletal:        General: Normal range of motion.     Cervical back: Normal range of motion and neck supple.     Right lower leg: No edema.     Left lower leg: No edema.  Skin:    General: Skin is warm.     Findings: Rash (Rash bilateral lower extremities mother states is not new) present.  Neurological:     Comments: Slurred speech.  Able to state her name.  No obvious cranial nerve deficits.  Extraocular movements intact.  No obvious  pronator drift.  Difficult to participating in exam, equal grip strength.  Able to hold bilateral lower extremities up to gravity.  With standing patient with weakness that appears to be to bilateral lower extremities.  Psychiatric:        Mood and Affect: Mood normal.     IMPRESSION / MDM / ASSESSMENT AND PLAN / ED COURSE  I reviewed the triage vital signs and the nursing notes.  On chart review do not see that the patient is on any anticoagulation  On arrival heart rate 99, blood pressure 100/70  Patient called and activated code stroke on arrival.  Last known well 8:45 AM.  Patient immediately taken back to CT scanner for CT head, CTA.  Neurology consulted immediately available, Dr. Selina Cooley evaluated the patient in CT.   EKG  I, Corena Herter, the attending physician, personally viewed and interpreted this ECG.   Rate: Normal  Rhythm: Normal sinus  Axis: Normal  Intervals: Normal  ST&T Change: None  No tachycardic or bradycardic dysrhythmias while on cardiac telemetry.  RADIOLOGY I independently reviewed imaging, my interpretation of imaging: CT scan of the head without  signs of intracranial hemorrhage  CTA with no LVO or signs of dissection  LABS (all labs ordered are listed, but only abnormal results are displayed) Labs interpreted as -    Labs Reviewed  CBC - Abnormal; Notable for the following components:      Result Value   Hemoglobin 15.1 (*)    RDW 17.0 (*)    Platelets 95 (*)    All other components within normal limits  DIFFERENTIAL - Abnormal; Notable for the following components:   Lymphs Abs 0.5 (*)    All other components within normal limits  COMPREHENSIVE METABOLIC PANEL - Abnormal; Notable for the following components:   Glucose, Bld 154 (*)    Creatinine, Ser 1.30 (*)    Calcium 8.6 (*)    Albumin 2.9 (*)    GFR, Estimated 53 (*)    All other components within normal limits  URINALYSIS, ROUTINE W REFLEX MICROSCOPIC - Abnormal; Notable for the following components:   Color, Urine YELLOW (*)    APPearance CLEAR (*)    Specific Gravity, Urine >1.046 (*)    Hgb urine dipstick MODERATE (*)    Leukocytes,Ua SMALL (*)    All other components within normal limits  CBG MONITORING, ED - Abnormal; Notable for the following components:   Glucose-Capillary 123 (*)    All other components within normal limits  RESP PANEL BY RT-PCR (RSV, FLU A&B, COVID)  RVPGX2  PROTIME-INR  APTT  ETHANOL  BLOOD GAS, VENOUS  POC URINE PREG, ED     MDM   Clinical Course as of 04/06/23 1436  Mon Apr 06, 2023  1133 MRI without findings of acute stroke.  Mother stating that she was holding her privates earlier today.  Will continue to evaluate for possible infectious source. [SM]  1435 MRI read as - Abnormally nodular, expanded and T2/FLAIR heterogeneous  appearance of the ventral lower medulla, right greater than left.  This is isointense on DWI and T1 without contrast.  Recommend follow-up Brain MRI with contrast to evaluate for abnormal  enhancement. Main differential considerations at this point are  brainstem hamartoma and primary brainstem  tumor.  Discussed with Dr. Selina Cooley.  Ordered MRI with contrast.  On reevaluation patient continues to be somnolent but easily arousable. [SM]    Clinical Course User Index [SM] Corena Herter, MD  PROCEDURES:  Critical Care performed: yes  .Critical Care  Performed by: Corena Herter, MD Authorized by: Corena Herter, MD   Critical care provider statement:    Critical care time (minutes):  30   Critical care time was exclusive of:  Separately billable procedures and treating other patients   Critical care was necessary to treat or prevent imminent or life-threatening deterioration of the following conditions:  CNS failure or compromise   Critical care was time spent personally by me on the following activities:  Development of treatment plan with patient or surrogate, discussions with consultants, evaluation of patient's response to treatment, examination of patient, ordering and review of laboratory studies, ordering and review of radiographic studies, ordering and performing treatments and interventions, pulse oximetry, re-evaluation of patient's condition and review of old charts   Care discussed with: admitting provider     Patient's presentation is most consistent with acute presentation with potential threat to life or bodily function.   MEDICATIONS ORDERED IN ED: Medications  sodium chloride flush (NS) 0.9 % injection 3 mL (3 mLs Intravenous Not Given 04/06/23 1208)  sodium chloride 0.9 % bolus 1,000 mL (1,000 mLs Intravenous New Bag/Given 04/06/23 1207)  LORazepam (ATIVAN) injection 0.5 mg (0.5 mg Intravenous Given 04/06/23 1100)  iohexol (OMNIPAQUE) 350 MG/ML injection 75 mL (75 mLs Intravenous Contrast Given 04/06/23 1056)    FINAL CLINICAL IMPRESSION(S) / ED DIAGNOSES   Final diagnoses:  Altered mental status, unspecified altered mental status type  Dysarthria  Weakness     Rx / DC Orders   ED Discharge Orders     None        Note:  This document was  prepared using Dragon voice recognition software and may include unintentional dictation errors.   Corena Herter, MD 04/06/23 1436

## 2023-04-06 NOTE — ED Notes (Signed)
 Resumed care from ellen rn.  Pt in mri

## 2023-04-06 NOTE — ED Notes (Signed)
 Pt is in MRI, telehospitalist is in MRI with pt

## 2023-04-06 NOTE — Progress Notes (Addendum)
 CODE STROKE- PHARMACY COMMUNICATION  Time CODE STROKE called/page received: 1037  Time response to CODE STROKE was made (in person or via phone): 1039, in person  Time Stroke Kit retrieved from Pyxis (only if needed): No thrombolytic per neurologist  Name of Provider/Nurse contacted: Dr. Selina Cooley  Past Medical History:  Diagnosis Date   CKD (chronic kidney disease)    Down syndrome    Elevated hemoglobin (HCC)    Erythrocytosis    Erythrocytosis    Granulomatous uveitis    Hirsutism    Hypothyroid    Stage 3b chronic kidney disease (CKD) (HCC)    Prior to Admission medications   Medication Sig Start Date End Date Taking? Authorizing Provider  levothyroxine (SYNTHROID) 88 MCG tablet Take by mouth. 03/05/20   [provider]  prednisoLONE acetate (PRED FORTE) 1 % ophthalmic suspension Apply to eye. 03/27/21   [provider]    Will M. Dareen Piano, PharmD Clinical Pharmacist 04/06/2023 11:00 AM

## 2023-04-06 NOTE — ED Notes (Signed)
 Parents attentive at bedside.  Pt ambulated to the bathroom in the room to void.

## 2023-04-06 NOTE — ED Notes (Signed)
 Patient transported to MRI

## 2023-04-06 NOTE — Progress Notes (Signed)
   04/06/23 1030  Spiritual Encounters  Type of Visit Initial  Care provided to: Pt and family  Conversation partners present during encounter Nurse;Physician  Referral source Code page  Reason for visit Code  OnCall Visit Yes  Interventions  Spiritual Care Interventions Made Established relationship of care and support;Compassionate presence;Reflective listening;Normalization of emotions  Intervention Outcomes  Outcomes Connection to spiritual care  Spiritual Care Plan  Spiritual Care Issues Still Outstanding No further spiritual care needs at this time (see row info)  Advance Directives (For Healthcare)  Does Patient Have a Medical Advance Directive? No  Mental Health Advance Directives  Does Patient Have a Mental Health Advance Directive? No   Parents concerned because they could not be with their daughter during procedures. Chaplain went to CT and MRI to see patient to give parents peace of mind the patient was doing ok.

## 2023-04-06 NOTE — ED Notes (Signed)
 Dr bradler in with pt and family

## 2023-04-06 NOTE — Progress Notes (Signed)
 Telestroke Note   1034: Code stroke cart activated. Patient in CT who presents with concerns for leg weakness, difficulty ambulating, worsening slurred speech, and difficulty swallowing. LKW per family is 30. mRS 3.   1035: Dr.Mumma in CT providing updates. Dr.Stack paged at this time.   1041: CT imaging completed. Dr.Stack in CT performing neuro evaluation.   1046: Advanced imaging ordered by Dr.Stack.   1056: Advanced CT imaging completed.   1058: Per Dr.Stack, patient to transfer to MRI. Per Dr.Stack, no further needs from telestroke nurse at this time. Dr.Stack stated she would provide the telestroke team with updates. Logged off stroke cart at this time.    Derrill Kay Telestroke RN

## 2023-04-06 NOTE — ED Triage Notes (Signed)
 Patient to ED via POV for slurred speech with difficulty eating and walking. LWK today 0845. No weakness. Slurred speech at baseline but mother worse today. Hx of downs syndrome. C/o of pain in left leg. PT appearing more drowsy. Denies blood thinners.  Mumma, MD in triage- code stroke called

## 2023-04-06 NOTE — H&P (Addendum)
 History and Physical    Laguana Desautel NWG:[id number] DOB: 07/21/1980 DOA: 04/06/2023  PCP: System, Provider Not In (Confirm with patient/family/NH records and if not entered, this has to be entered at [facility] point of entry) Patient coming from: Home  I have personally briefly reviewed patient's old medical records in [facility]  Chief Complaint: AMS  HPI: Sarah Barker is a 43 y.o. female with medical history significant of Down syndrome, brought in by family member for duration of acute altered mentation.  Patient has altered mentation unable to provide any history, all history provided by mother at bedside.  Symptoms happened this morning, mother in the next room and heard the patient choking and gagging and went to see the patient and found patient staring forward unresponsive with eyes wide open.  Few minutes later able to talk but only mumbling undiscernible.  Denied any loss control of urine and bowel movement during the episode no LOC.  Mother watched the patient for few hours and decided to bring her in after no significant improvement of her mentations.  No fall.  ED Course: Afebrile, not tachycardia nonhypotensive.  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  UA negative for UTI, blood work showed WBC 4.0, creatinine 1.3 BUN 15.  Review of Systems: Unable to perform, patient remained confused.  Past Medical History:  Diagnosis Date   CKD (chronic kidney disease)    Down syndrome    Elevated hemoglobin (HCC)    Erythrocytosis    Erythrocytosis    Granulomatous uveitis    Hirsutism    Hypothyroid    Stage 3b chronic kidney disease (CKD) (HCC)     Past Surgical History:  Procedure Laterality Date   ATRIOVENTRICULAR CANAL REPAIR, COMPLETE     CARDIAC SURGERY       reports that she has never smoked. She has never used smokeless tobacco. No history on file for alcohol use and drug use.  No Known  Allergies  History reviewed. No pertinent family history.   Prior to Admission medications   Medication Sig Start Date End Date Taking? Authorizing Provider  levothyroxine (SYNTHROID) 88 MCG tablet Take by mouth. 03/05/20   [provider]  prednisoLONE acetate (PRED FORTE) 1 % ophthalmic suspension Apply to eye. 03/27/21   [provider]    Physical Exam: Vitals:   04/06/23 1430 04/06/23 1500 04/06/23 1610 04/06/23 1620  BP: 107/79 106/69 104/73 106/76  Pulse: 94 74 77 66  Resp: (!) 21 13 19 15   Temp:  97.6 F (36.4 C)    TempSrc:  Oral    SpO2: 94% 98% 98% 100%  Weight:        Constitutional: NAD, calm, comfortable Vitals:   04/06/23 1430 04/06/23 1500 04/06/23 1610 04/06/23 1620  BP: 107/79 106/69 104/73 106/76  Pulse: 94 74 77 66  Resp: (!) 21 13 19 15   Temp:  97.6 F (36.4 C)    TempSrc:  Oral    SpO2: 94% 98% 98% 100%  Weight:       Eyes: PERRL, lids and conjunctivae normal ENMT: Mucous membranes are moist. Posterior pharynx clear of any exudate or lesions.Normal dentition.  Neck: normal, supple, no masses, no thyromegaly Respiratory: clear to auscultation bilaterally, no wheezing, no crackles. Normal respiratory effort. No accessory muscle use.  Cardiovascular: Regular rate and rhythm, no murmurs / rubs / gallops. No extremity edema. 2+ pedal pulses. No carotid bruits.  Abdomen: no tenderness, no  masses palpated. No hepatosplenomegaly. Bowel sounds positive.  Musculoskeletal: no clubbing / cyanosis. No joint deformity upper and lower extremities. Good ROM, no contractures. Normal muscle tone.  Skin: no rashes, lesions, ulcers. No induration Neurologic: No facial droops, moving all limbs, following some simple commands Psychiatric: Not responding to direct questions    Labs on Admission: I have personally reviewed following labs and imaging studies  CBC: Recent Labs  Lab 04/06/23 1045  WBC 4.0  NEUTROABS 2.6  HGB 15.1*  HCT 43.9  MCV  90.7  PLT 95*   Basic Metabolic Panel: Recent Labs  Lab 04/06/23 1045  NA 139  K 3.6  CL 105  CO2 27  GLUCOSE 154*  BUN 15  CREATININE 1.30*  CALCIUM 8.6*   GFR: CrCl cannot be calculated (Unknown ideal weight.). Liver Function Tests: Recent Labs  Lab 04/06/23 1045  AST 38  ALT 20  ALKPHOS 75  BILITOT 0.8  PROT 7.5  ALBUMIN 2.9*   No results for input(s): "LIPASE", "AMYLASE" in the last 168 hours. No results for input(s): "AMMONIA" in the last 168 hours. Coagulation Profile: Recent Labs  Lab 04/06/23 1045  INR 1.2   Cardiac Enzymes: No results for input(s): "CKTOTAL", "CKMB", "CKMBINDEX", "TROPONINI" in the last 168 hours. BNP (last 3 results) No results for input(s): "PROBNP" in the last 8760 hours. HbA1C: No results for input(s): "HGBA1C" in the last 72 hours. CBG: Recent Labs  Lab 04/06/23 1028  GLUCAP 123*   Lipid Profile: No results for input(s): "CHOL", "HDL", "LDLCALC", "TRIG", "CHOLHDL", "LDLDIRECT" in the last 72 hours. Thyroid Function Tests: No results for input(s): "TSH", "T4TOTAL", "FREET4", "T3FREE", "THYROIDAB" in the last 72 hours. Anemia Panel: No results for input(s): "VITAMINB12", "FOLATE", "FERRITIN", "TIBC", "IRON", "RETICCTPCT" in the last 72 hours. Urine analysis:    Component Value Date/Time   COLORURINE YELLOW (A) 04/06/2023 1207   APPEARANCEUR CLEAR (A) 04/06/2023 1207   LABSPEC >1.046 (H) 04/06/2023 1207   PHURINE 6.0 04/06/2023 1207   GLUCOSEU NEGATIVE 04/06/2023 1207   HGBUR MODERATE (A) 04/06/2023 1207   BILIRUBINUR NEGATIVE 04/06/2023 1207   KETONESUR NEGATIVE 04/06/2023 1207   PROTEINUR NEGATIVE 04/06/2023 1207   NITRITE NEGATIVE 04/06/2023 1207   LEUKOCYTESUR SMALL (A) 04/06/2023 1207    Radiological Exams on Admission: MR BRAIN W CONTRAST Result Date: 04/06/2023 CLINICAL DATA:  For further evaluation of brainstem abnormality seen on earlier MRI EXAM: MRI HEAD WITH CONTRAST TECHNIQUE: Multiplanar, multiecho  pulse sequences of the brain and surrounding structures were obtained with intravenous contrast. CONTRAST:  7mL GADAVIST GADOBUTROL 1 MMOL/ML IV SOLN COMPARISON:  Noncontrast brain MRI 04/06/2023 FINDINGS: This examination is performed as an adjunct to the earlier noncontrast brain MRI. There is no abnormal contrast within the brain parenchyma. Specifically, there is no abnormal enhancement of the nodular region of the ventral medulla oblongata. IMPRESSION: No abnormal enhancement of the nodular region of the ventral medulla oblongata. Brainstem hemorrhage Shella Spearing remains the primary differential consideration. Consider follow-up MRI with and without contrast in 8-12 weeks. Electronically Signed   By: Deatra Robinson M.D.   On: 04/06/2023 16:12   MR BRAIN WO CONTRAST Result Date: 04/06/2023 CLINICAL DATA:  43 year old female neurologic deficit, code stroke. Down syndrome. EXAM: MRI HEAD WITHOUT CONTRAST TECHNIQUE: Multiplanar, multiecho pulse sequences of the brain and surrounding structures were obtained without intravenous contrast. COMPARISON:  CT head and CTA head and neck today. FINDINGS: Brain: Partially empty sella again noted. No midline shift, intracranial mass effect or ventriculomegaly. No restricted diffusion  or evidence of acute infarction. However, the lower brainstem is abnormal at the level of the medulla (series 13, image 17) which demonstrates right greater than left bilateral nodular enlargement, T2 and FLAIR hyperintensity (also series 9, images 6 and 7). The ventral in central portion of the brainstem there are affected. The dorsal medulla seems spared. 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 This area is isointense on DWI, noncontrast T1 (series 14, image 7). No similar T2 or FLAIR signal abnormality elsewhere in the brain. Cerebellum, pons, midbrain appear normal. No acute or chronic  cerebral blood products identified. Minimal nonspecific cerebral white matter T2 and FLAIR hyperintensity in the left centrum semiovale. No encephalomalacia identified. Vascular: Major intracranial vascular flow voids are preserved, stable from earlier CTA. Skull and upper cervical spine: Dark T1 marrow signal and degenerative cervical vertebral changes also seen by CTA today. Grossly negative visible cervical spinal cord. Sinuses/Orbits: Postoperative changes to both globes. Paranasal sinuses and mastoids are stable and well aerated. Other: Grossly negative visible internal auditory structures. Negative visible scalp and face. IMPRESSION: 1. Abnormally nodular, expanded and T2/FLAIR heterogeneous appearance of the ventral lower medulla, right greater than left. This is isointense on DWI and T1 without contrast. Recommend follow-up Brain MRI with contrast to evaluate for abnormal enhancement. Main differential considerations at this point are brainstem hamartoma and primary brainstem tumor. 2. No evidence of brain ischemia or other acute intracranial abnormality. Electronically Signed   By: Odessa Fleming M.D.   On: 04/06/2023 11:41   CT ANGIO HEAD NECK W WO CM (CODE STROKE) Result Date: 04/06/2023 CLINICAL DATA:  43 year old female code stroke. EXAM: CT ANGIOGRAPHY HEAD AND NECK TECHNIQUE: Multidetector CT imaging of the head and neck was performed using the standard protocol during bolus administration of intravenous contrast. Multiplanar CT image reconstructions and MIPs were obtained to evaluate the vascular anatomy. Carotid stenosis measurements (when applicable) are obtained utilizing NASCET criteria, using the distal internal carotid diameter as the denominator. RADIATION DOSE REDUCTION: This exam was performed according to the departmental dose-optimization program which includes automated exposure control, adjustment of the mA and/or kV according to patient size and/or use of iterative reconstruction technique.  CONTRAST:  75mL OMNIPAQUE IOHEXOL 350 MG/ML SOLN COMPARISON:  Plain head CT 1037 hours today. FINDINGS: CTA NECK Skeleton: Age advanced cervical spine disc and endplate degeneration. No acute osseous abnormality identified. Upper chest: Nonspecific superior mediastinal lymphadenopathy, when compared to prior chest CT 05/09/2021 no significant change (please see that report). Upper lung atelectasis. Other neck: No cervical lymphadenopathy. Neck soft tissue spaces are within normal limits. Aortic arch: 4 vessel arch, left vertebral artery arises directly from the arch. Right carotid system: Patent, negative. Left carotid system: Patent, negative. Vertebral arteries: Right vertebral artery arises immediately after the CCA from the brachiocephalic with a normal origin. The vessel has a late entry into the cervical transverse foramen and appears dominant, patent to the skull base without stenosis. Non dominant left vertebral artery arises directly from the arch, has a late entry into the transverse foramen, and remains patent with no plaque or stenosis to the skull base. CTA HEAD Posterior circulation: Patent distal vertebral arteries, the right V4 is dominant. Patent PICA origins and vertebrobasilar junction which appears mildly fenestrated (normal variant). Patent basilar artery without stenosis. Patent SCA and left PCA origin with fetal type right PCA origin. Left posterior communicating artery diminutive or absent. Bilateral PCA branches are within  normal limits. Anterior circulation: Both ICA siphons are patent, mildly tortuous. No siphon plaque or stenosis. Normal right posterior communicating artery origin. Patent MCA and ACA origins. Tortuous A1 and M1 vessels. Right A1 is dominant, left A1 is diminutive or absent. Anterior communicating artery and median artery of the corpus callosum are within normal limits. Bilateral ACA branches are within normal limits. Left MCA M1 segment and bifurcation are patent with  tortuosity, no stenosis. Similar tortuous right MCA M1 segment and patent right MCA trifurcation. Bilateral MCA branches are within normal limits. Venous sinuses: Early contrast timing, grossly patent. Anatomic variants: Non dominant left vertebral artery arises directly from the arch. Both vertebral arteries have a late entry into the cervical transverse foramen. Fetal right PCA origin. Dominant right and diminutive left ACA A1 segment. Review of the MIP images confirms the above findings IMPRESSION: 1. Negative CTA Head and Neck; no large vessel occlusion, atherosclerotic plaque, or stenosis. 2. Mediastinal Lymphadenopathy, persists although seems stable from a Chest CT 05/09/2021. This is nonspecific. Recommend referral to [facility]). These results were communicated to Dr. Selina Cooley at 11:08 am on 04/06/2023 by text page via the [facility] messaging system. Electronically Signed   By: Odessa Fleming M.D.   On: 04/06/2023 11:09   CT HEAD CODE STROKE WO CONTRAST Result Date: 04/06/2023 CLINICAL DATA:  Provided history: Code stroke. Neuro deficit, acute, stroke suspected. Additional history provided: slurred speech. Difficulty eating. Difficulty walking. EXAM: CT HEAD WITHOUT CONTRAST TECHNIQUE: Contiguous axial images were obtained from the base of the skull through the vertex without intravenous contrast. RADIATION DOSE REDUCTION: This exam was performed according to the departmental dose-optimization program which includes automated exposure control, adjustment of the mA and/or kV according to patient size and/or use of iterative reconstruction technique. COMPARISON:  None. FINDINGS: Brain: Generalized cerebral atrophy, mild but greater than expected for age. Expanded and partially empty sella turcica. There is no acute intracranial hemorrhage. No demarcated cortical infarct. No extra-axial fluid collection. No evidence of an intracranial mass. No midline shift. Vascular: No hyperdense  vessel. Skull: No calvarial fracture or aggressive osseous lesion. Sinuses/Orbits: No mass or acute finding within the imaged orbits. Partially imaged mucous retention cysts and/or polyps within the left maxillary sinus. Other: Right mastoid effusion. Right mastoid air cell sclerosis also noted. ASPECTS [facility] Stroke Program Early CT Score) - Ganglionic level infarction (caudate, lentiform nuclei, internal capsule, insula, M1-M3 cortex): 7 - Supraganglionic infarction (M4-M6 cortex): 3 Total score (0-10 with 10 being normal): 10 No evidence of an acute intracranial abnormality. These results were communicated to Dr. Selina Cooley at 10:55 amon 3/24/2025by text page via the [facility] messaging system. IMPRESSION: 1.  No evidence of an acute intracranial abnormality. 2. Expanded and partially empty sella turcica. This finding can reflect incidental anatomic variation, or alternatively, it can be associated with chronic idiopathic intracranial hypertension (pseudotumor cerebri). 3. Generalized cerebral atrophy, mild but greater than expected for age. 4. Partially imaged mucous retention cysts and/or polyps within the left maxillary sinus. 5. Right mastoid effusion. Right mastoid air cell sclerosis also noted, suggesting sequela of chronic/recurrent mastoiditis. Electronically Signed   By: Jackey Loge D.O.   On: 04/06/2023 10:56    EKG: Independently reviewed.  Sinus, chronic RBBB, no acute ST changes.  Assessment/Plan Principal Problem:   Seizure (HCC)  (please populate well all problems here in Problem List. (For example, if patient is on BP meds at home and you resume or decide to hold them, it is a  problem that needs to be her. Same for CAD, COPD, HLD and so on)  AMS Question of new onset of seizure -Neurology consultation appreciated, neurology recommended workup for hamartoma associated seizure -EEG -Seizure precaution -Monitor off antiseizure medication for tonight. -PRN Ativan for seizure. -Given  persistent speech problems and mentation changes, suspect possible dysphagia, with increasing aspiration risk and plan to keep patient n.p.o. and start IV fluid.  Speech evaluation to follow.  Thrombocytopenia -Chronic, platelet level stable, outpatient follow-up with hematology  Down syndrome -No acute concern  DVT prophylaxis: SCD Code Status: Full code Family Communication: Mother at bedside Disposition Plan: Expect less than 2 midnight hospital stay Consults called: Neuro Admission status: Tele obs   Emeline General MD [facility] Pager [phone]  04/06/2023, 5:28 PM

## 2023-04-06 NOTE — ED Provider Notes (Signed)
 Emergency department handoff note  Care of this patient was signed out to me at the end of the previous provider shift.  All pertinent patient information was conveyed and all questions were answered.  Patient pending MRI of the brain with contrast given abnormal MRI without concerning for possible brainstem tumor.  MRI read by Ernestine Mcmurray in neurosurgery as well as coming Selina Cooley who do not feel that this does not need surgical intervention at this time. Dr. Selina Cooley recommends admission to the internal medicine service with reevaluation and spot EEG in the morning.  Also recommends AED for any interictal symptoms as well.  Dispo: Admit to medicine   Merwyn Katos, MD 04/06/23 [phone]

## 2023-04-06 NOTE — ED Notes (Signed)
 Dinner tray given to pt  mother with pt

## 2023-04-06 NOTE — ED Notes (Signed)
 Pt return form mri, pt sleeping siderails up x 2.  Mother with pt.

## 2023-04-06 NOTE — ED Triage Notes (Signed)
 Secretary notified to call code stroke, pt going to CT 1

## 2023-04-06 NOTE — ED Notes (Signed)
 CODE  STROKE  CALLED  TO [facility]  AT  10:23AM

## 2023-04-06 NOTE — Consult Note (Signed)
 NEUROLOGY CONSULT NOTE   Date of service: April 06, 2023 Patient Name: Sarah Barker MRN:  [id number] DOB:  [birth year] Chief Complaint: dysarthria, confusion Requesting Provider: Merwyn Katos, MD  History of Present Illness  Sarah Barker is a 43 y.o. female with hx of Down syndrome, thyroid disorder who presented to the emergency department after change of P speech with difficulty swallowing and bilateral leg weakness.  History is provided by patient's mother.  At baseline patient is able to ambulate on her own and can identify objects such as pictures of animals but would not know for example what month it is.  Patient woke up at 830 this morning in her usual state and was walking around and standing.  Last known well at 8:45 AM after which mother heard her choking and coughing from the other room.  Mother went in and found her choking on a bagel.  She was noted to have slurred speech and also difficulty standing with bilateral leg weakness at that time.  No recent falls.  Not on anticoagulation.  No new medications.  On my examination which she was nonfocal and had no weakness in her legs.  She did have dysarthria which her mother said was improved but still present.  More concerning she replied to most of my questions with the word "horse" and identified each object I asked her to name on the NIHSS as "horse." CT head showed no acute process and CTA showed no LVO on personal review. MRI brain was performed prior to treatment with TNK 2/2 low suspicion for stroke in setting of nonfocal exam. MRI brain did not show a stroke therefore TNK was not offered. MRI brain wo contrast did show findings c/f possible brainstem hamartoma or primary tumor in that location  LKW: 0845 Modified rankin score: 3-Moderate disability-requires help but walks WITHOUT assistance IV Thrombolysis: No, no stroke on MRI EVT: No, no LVO  NIHSS components Score: Comment  1a Level of Conscious 0[x]  1[]  2[]  3[]      1b  LOC Questions 0[]  1[x]  2[]       1c LOC Commands 0[x]  1[]  2[]       2 Best Gaze 0[x]  1[]  2[]       3 Visual 0[x]  1[]  2[]  3[]      4 Facial Palsy 0[x]  1[]  2[]  3[]      5a Motor Arm - left 0[]  1[x]  2[]  3[]  4[]  UN[]    5b Motor Arm - Right 0[]  1[x]  2[]  3[]  4[]  UN[]    6a Motor Leg - Left 0[]  1[x]  2[]  3[]  4[]  UN[]    6b Motor Leg - Right 0[]  1[x]  2[]  3[]  4[]  UN[]    7 Limb Ataxia 0[x]  1[]  2[]  3[]  UN[]     8 Sensory 0[x]  1[]  2[]  UN[]      9 Best Language 0[]  1[]  2[x]  3[]      10 Dysarthria 0[]  1[x]  2[]  UN[]      11 Extinct. and Inattention 0[x]  1[]  2[]       TOTAL:  8      ROS  UTA 2/2 mental status  Past History   Past Medical History:  Diagnosis Date   CKD (chronic kidney disease)    Down syndrome    Elevated hemoglobin (HCC)    Erythrocytosis    Erythrocytosis    Granulomatous uveitis    Hirsutism    Hypothyroid    Stage 3b chronic kidney disease (CKD) (HCC)     Past Surgical History:  Procedure Laterality Date   ATRIOVENTRICULAR CANAL REPAIR, COMPLETE  CARDIAC SURGERY      Family History: History reviewed. No pertinent family history.  Social History  reports that she has never smoked. She has never used smokeless tobacco. No history on file for alcohol use and drug use.  No Known Allergies  Medications   Current Facility-Administered Medications:    sodium chloride flush (NS) 0.9 % injection 3 mL, 3 mL, Intravenous, Once, Mumma, Shannon, MD  Current Outpatient Medications:    levothyroxine (SYNTHROID) 88 MCG tablet, Take by mouth., Disp: , Rfl:    prednisoLONE acetate (PRED FORTE) 1 % ophthalmic suspension, Apply to eye., Disp: , Rfl:   Vitals   Vitals:   2023/04/16 1330 04-16-2023 1400 16-Apr-2023 1430 Apr 16, 2023 1500  BP: 108/64 98/66 107/79 106/69  Pulse: 87 83 94 74  Resp: (!) 29 (!) 25 (!) 21 13  Temp:  (!) 97.5 F (36.4 C)  97.6 F (36.4 C)  TempSrc:  Oral  Oral  SpO2: 96% 95% 94% 98%  Weight:        Body mass index is 35.42 kg/m.  Physical Exam   Gen:  patient lying in bed, NAD CV: extremities appear well-perfused Resp: normal WOB  Neurologic Examination   MS: alert, oriented to self, age, and mother at bedside, follows commands Speech: mild dysarthria, prominent anomia, perseverates on the word "horse" CN: PERRL, VFF, EOMI, sensation intact, face symmetric, hearing intact to voice Motor: mild drift in all extremities, symmetric Sensory: SILT Coordination: UTA 2/2 confusion with multi-step commands Gait: deferred  Labs/Imaging/Neurodiagnostic studies   CBC:  Recent Labs  Lab 2023/04/16 1045  WBC 4.0  NEUTROABS 2.6  HGB 15.1*  HCT 43.9  MCV 90.7  PLT 95*   Basic Metabolic Panel:  Lab Results  Component Value Date   NA 139 2023-04-16   K 3.6 04-16-2023   CO2 27 April 16, 2023   GLUCOSE 154 (H) 04-16-2023   BUN 15 Apr 16, 2023   CREATININE 1.30 (H) 04-16-2023   CALCIUM 8.6 (L) 04/16/2023   GFRNONAA 53 (L) Apr 16, 2023   Lipid Panel: No results found for: "LDLCALC" HgbA1c: No results found for: "HGBA1C" Urine Drug Screen: No results found for: "LABOPIA", "COCAINSCRNUR", "LABBENZ", "AMPHETMU", "THCU", "LABBARB"  Alcohol Level     Component Value Date/Time   ETH <10 16-Apr-2023 1045   INR  Lab Results  Component Value Date   INR 1.2 04/16/2023   APTT  Lab Results  Component Value Date   APTT 28 16-Apr-2023   AED levels: No results found for: "PHENYTOIN", "ZONISAMIDE", "LAMOTRIGINE", "LEVETIRACETA"  CT Head without contrast(Personally reviewed): No acute process  CT angio Head and Neck with contrast(Personally reviewed): No LVO  MRI Brain wo (Personally reviewed): 1. Abnormally nodular, expanded and T2/FLAIR heterogeneous appearance of the ventral lower medulla, right greater than left. This is isointense on DWI and T1 without contrast. Recommend follow-up Brain MRI with contrast to evaluate for abnormal enhancement. Main differential considerations at this point are brainstem hamartoma and primary brainstem  tumor.   2. No evidence of brain ischemia or other acute intracranial abnormality.  ASSESSMENT   Sarah Barker is a 43 y.o. female with hx of Down syndrome, thyroid disorder who presented to the emergency department after change of P speech with difficulty swallowing and bilateral leg weakness.  MRI brain showed no e/o acute ischemia but did show abnormally nodular expanded and T2/FLAIR heterogeneous appearance of ventral lower medulla c/f possible brainstem hamartoma or possible brainstem tumor.  RECOMMENDATIONS   - MRI brain w contrast for further characterization  of medullary abnormality - Neurology to follow ______________________________________________________________________    Signed, Jefferson Fuel, MD [facility]

## 2023-04-06 NOTE — ED Notes (Signed)
 Attempt at swallow screen, pt did seem to have difficulties swallowing (as she did at home with mom) will re-try in a little bit.

## 2023-04-07 ENCOUNTER — Observation Stay

## 2023-04-07 ENCOUNTER — Other Ambulatory Visit: Payer: Self-pay

## 2023-04-07 DIAGNOSIS — R4182 Altered mental status, unspecified: Secondary | ICD-10-CM | POA: Diagnosis not present

## 2023-04-07 DIAGNOSIS — R569 Unspecified convulsions: Secondary | ICD-10-CM | POA: Diagnosis not present

## 2023-04-07 LAB — HIV ANTIBODY (ROUTINE TESTING W REFLEX): HIV Screen 4th Generation wRfx: NONREACTIVE

## 2023-04-07 MED ORDER — ORAL CARE MOUTH RINSE: 15.0000 mL | OROMUCOSAL | Status: DC

## 2023-04-07 MED ORDER — ORAL CARE MOUTH RINSE: 15.0000 mL | OROMUCOSAL | Status: DC | PRN

## 2023-04-07 NOTE — Progress Notes (Signed)
Per Dr Sreenath, dc tele monitoring  

## 2023-04-07 NOTE — Discharge Summary (Signed)
 Physician Discharge Summary  Sarah Barker JXB:[id number] DOB: [birth year] DOA: 04/06/2023  PCP: Sarah Barker  Admit date: 04/06/2023 Discharge date: 04/07/2023  Admitted From: Home Disposition:  Home  Recommendations for Outpatient Follow-up:  Follow up with PCP Barker 1-2 weeks Ambulatory referral to [facility] neurology  Home Health: No Equipment/Devices: None  Discharge Condition: Stable CODE STATUS: Full Diet recommendation: Regular  Brief/Interim Summary: 43 y.o. female with medical history significant of Down syndrome, brought Barker by family member for duration of acute altered mentation.   Patient has altered mentation unable to provide any history, all history provided by mother at bedside.  Symptoms happened this morning, mother Barker the next room and heard the patient choking and gagging and went to see the patient and found patient staring forward unresponsive with eyes wide open.  Few minutes later able to talk but only mumbling undiscernible.  Denied any loss control of urine and bowel movement during the episode no LOC.  Mother watched the patient for few hours and decided to bring her Barker after no significant improvement of her mentations.  No fall.   ED Course: Afebrile, not tachycardia nonhypotensive.  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  UA negative for UTI, blood work showed WBC 4.0, creatinine 1.3 BUN 15.  MRI significant for hamartoma.  No evidence of CVA.  EEG performed and reviewed by neurology.  Diffuse slowing not unexpected given underlying Down syndrome and developmental delay.  No evidence of epileptiform discharge.  No indication for antiepileptic therapy at this time.  Patient stable for discharge.  Ambulatory referral to [facility] neurology Barker 3 months.    Discharge Diagnoses:  Principal Problem:   Seizure (HCC)  Acute encephalopathy Unclear etiology.  No evidence of true seizure  activity.  EEG reviewed by neurology.  Diffuse slowing.  No epileptiform discharge.  No indication for antiseizure medication at time of discharge.  Patient did well with speech therapy and prove regular diet.  Stable for discharge.  Ambulatory referral to [facility] neurology Barker 3 months.  Discharge Instructions  Discharge Instructions     Ambulatory referral to Neurology   Complete by: As directed    Diet - low sodium heart healthy   Complete by: As directed    Increase activity slowly   Complete by: As directed       Allergies as of 04/07/2023   No Known Allergies      Medication List     STOP taking these medications    prednisoLONE acetate 1 % ophthalmic suspension Commonly known as: PRED FORTE       TAKE these medications    clobetasol cream 0.05 % Commonly known as: TEMOVATE Apply 1 Application topically 2 (two) times daily.   doxycycline 100 MG capsule Commonly known as: MONODOX Take 100 mg by mouth 2 (two) times daily.   levothyroxine 88 MCG tablet Commonly known as: SYNTHROID Take by mouth.        Follow-up Information     Sarah Face, MD Follow up.   Specialty: Neurology Why: Referral has been placed for Dr Sarah Barker from [facility] neurology.  Please call their office if you do not hear from them within 1 week.  You need to be seen Barker approximately 3 months. Contact information: [address] [facility] [place] [address] [phone]                No Known Allergies  Consultations: Neurology   Procedures/Studies: EEG adult Result Date: 04/07/2023 Sarah Fuel, MD     04/07/2023  2:19 PM Routine EEG Report Sarah Barker is a 43 y.o. female with a history of altered mental status who is undergoing an EEG to evaluate for seizures. Report: This EEG was acquired with electrodes placed according to the International 10-20 electrode system (including Fp1, Fp2, F3, F4, C3, C4, P3, P4, O1, O2, T3, T4, T5,  T6, A1, A2, Fz, Cz, Pz). The following electrodes were missing or displaced: none. The occipital dominant rhythm was 7 Hz. This activity is reactive to stimulation. Drowsiness was manifested by background fragmentation; deeper stages of sleep were identified by K complexes and sleep spindles. There was no focal slowing. There were no interictal epileptiform discharges. There were no electrographic seizures identified. There was no abnormal response to photic stimulation or hyperventilation. Impression and clinical correlation: This EEG was obtained while awake and asleep and is abnormal due to mild diffuse slowing indicative of global cerebral dysfunction. Epileptiform abnormalities were not seen during this recording. Sarah Neighbors, MD [facility] [phone] If 7pm- 7am, please page neurology on call as listed Barker AMION.   MR BRAIN W CONTRAST Result Date: 04/06/2023 CLINICAL DATA:  For further evaluation of brainstem abnormality seen on earlier MRI EXAM: MRI HEAD WITH CONTRAST TECHNIQUE: Multiplanar, multiecho pulse sequences of the brain and surrounding structures were obtained with intravenous contrast. CONTRAST:  7mL GADAVIST GADOBUTROL 1 MMOL/ML IV SOLN COMPARISON:  Noncontrast brain MRI 04/06/2023 FINDINGS: This examination is performed as an adjunct to the earlier noncontrast brain MRI. There is no abnormal contrast within the brain parenchyma. Specifically, there is no abnormal enhancement of the nodular region of the ventral medulla oblongata. IMPRESSION: No abnormal enhancement of the nodular region of the ventral medulla oblongata. Brainstem hemorrhage Sarah Barker remains the primary differential consideration. Consider follow-up MRI with and without contrast Barker 8-12 weeks. Electronically Signed   By: Sarah Barker M.D.   On: 04/06/2023 16:12   MR BRAIN WO CONTRAST Result Date: 04/06/2023 CLINICAL DATA:  43 year old female neurologic deficit, code stroke. Down syndrome. EXAM: MRI HEAD WITHOUT  CONTRAST TECHNIQUE: Multiplanar, multiecho pulse sequences of the brain and surrounding structures were obtained without intravenous contrast. COMPARISON:  CT head and CTA head and neck today. FINDINGS: Brain: Partially empty sella again noted. No midline shift, intracranial mass effect or ventriculomegaly. No restricted diffusion or evidence of acute infarction. However, the lower brainstem is abnormal at the level of the medulla (series 13, image 17) which demonstrates right greater than left bilateral nodular enlargement, T2 and FLAIR hyperintensity (also series 9, images 6 and 7). The ventral Barker central portion of the brainstem there are affected. The dorsal medulla seems spared. 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 This area is isointense on DWI, noncontrast T1 (series 14, image 7). No similar T2 or FLAIR signal abnormality elsewhere Barker the brain. Cerebellum, pons, midbrain appear normal. No acute or chronic cerebral blood products identified. Minimal nonspecific cerebral white matter T2 and FLAIR hyperintensity Barker the left centrum semiovale. No encephalomalacia identified. Vascular: Major intracranial vascular flow voids are preserved, stable from earlier CTA. Skull and upper cervical spine: Dark T1 marrow signal and degenerative cervical vertebral changes also seen by CTA today. Grossly negative visible cervical spinal cord. Sinuses/Orbits: Postoperative changes to both globes. Paranasal sinuses and mastoids are stable and well aerated. Other: Grossly negative visible internal auditory  structures. Negative visible scalp and Barker. IMPRESSION: 1. Abnormally nodular, expanded and T2/FLAIR heterogeneous appearance of the ventral lower medulla, right greater than left. This is isointense on DWI and T1 without contrast. Recommend follow-up Brain MRI with contrast to evaluate for abnormal enhancement. Main  differential considerations at this point are brainstem hamartoma and primary brainstem tumor. 2. No evidence of brain ischemia or other acute intracranial abnormality. Electronically Signed   By: Odessa Fleming M.D.   On: 04/06/2023 11:41   CT ANGIO HEAD NECK W WO CM (CODE STROKE) Result Date: 04/06/2023 CLINICAL DATA:  43 year old female code stroke. EXAM: CT ANGIOGRAPHY HEAD AND NECK TECHNIQUE: Multidetector CT imaging of the head and neck was performed using the standard protocol during bolus administration of intravenous contrast. Multiplanar CT image reconstructions and MIPs were obtained to evaluate the vascular anatomy. Carotid stenosis measurements (when applicable) are obtained utilizing NASCET criteria, using the distal internal carotid diameter as the denominator. RADIATION DOSE REDUCTION: This exam was performed according to the departmental dose-optimization program which includes automated exposure control, adjustment of the mA and/or kV according to patient size and/or use of iterative reconstruction technique. CONTRAST:  75mL OMNIPAQUE IOHEXOL 350 MG/ML SOLN COMPARISON:  Plain head CT 1037 hours today. FINDINGS: CTA NECK Skeleton: Age advanced cervical spine disc and endplate degeneration. No acute osseous abnormality identified. Upper chest: Nonspecific superior mediastinal lymphadenopathy, when compared to prior chest CT 05/09/2021 no significant change (please see that report). Upper lung atelectasis. Other neck: No cervical lymphadenopathy. Neck soft tissue spaces are within normal limits. Aortic arch: 4 vessel arch, left vertebral artery arises directly from the arch. Right carotid system: Patent, negative. Left carotid system: Patent, negative. Vertebral arteries: Right vertebral artery arises immediately after the CCA from the brachiocephalic with a normal origin. The vessel has a late entry into the cervical transverse foramen and appears dominant, patent to the skull base without stenosis. Non  dominant left vertebral artery arises directly from the arch, has a late entry into the transverse foramen, and remains patent with no plaque or stenosis to the skull base. CTA HEAD Posterior circulation: Patent distal vertebral arteries, the right V4 is dominant. Patent PICA origins and vertebrobasilar junction which appears mildly fenestrated (normal variant). Patent basilar artery without stenosis. Patent SCA and left PCA origin with fetal type right PCA origin. Left posterior communicating artery diminutive or absent. Bilateral PCA branches are within normal limits. Anterior circulation: Both ICA siphons are patent, mildly tortuous. No siphon plaque or stenosis. Normal right posterior communicating artery origin. Patent MCA and ACA origins. Tortuous A1 and M1 vessels. Right A1 is dominant, left A1 is diminutive or absent. Anterior communicating artery and median artery of the corpus callosum are within normal limits. Bilateral ACA branches are within normal limits. Left MCA M1 segment and bifurcation are patent with tortuosity, no stenosis. Similar tortuous right MCA M1 segment and patent right MCA trifurcation. Bilateral MCA branches are within normal limits. Venous sinuses: Early contrast timing, grossly patent. Anatomic variants: Non dominant left vertebral artery arises directly from the arch. Both vertebral arteries have a late entry into the cervical transverse foramen. Fetal right PCA origin. Dominant right and diminutive left ACA A1 segment. Review of the MIP images confirms the above findings IMPRESSION: 1. Negative CTA Head and Neck; no large vessel occlusion, atherosclerotic plaque, or stenosis. 2. Mediastinal Lymphadenopathy, persists although seems stable from a Chest CT 05/09/2021. This is nonspecific. Recommend referral to [facility] These results were communicated to  Dr. Selina Cooley at 11:08 am on 04/06/2023 by text page via the [facility] messaging system.  Electronically Signed   By: Odessa Fleming M.D.   On: 04/06/2023 11:09   CT HEAD CODE STROKE WO CONTRAST Result Date: 04/06/2023 CLINICAL DATA:  Provided history: Code stroke. Neuro deficit, acute, stroke suspected. Additional history provided: slurred speech. Difficulty eating. Difficulty walking. EXAM: CT HEAD WITHOUT CONTRAST TECHNIQUE: Contiguous axial images were obtained from the base of the skull through the vertex without intravenous contrast. RADIATION DOSE REDUCTION: This exam was performed according to the departmental dose-optimization program which includes automated exposure control, adjustment of the mA and/or kV according to patient size and/or use of iterative reconstruction technique. COMPARISON:  None. FINDINGS: Brain: Generalized cerebral atrophy, mild but greater than expected for age. Expanded and partially empty sella turcica. There is no acute intracranial hemorrhage. No demarcated cortical infarct. No extra-axial fluid collection. No evidence of an intracranial mass. No midline shift. Vascular: No hyperdense vessel. Skull: No calvarial fracture or aggressive osseous lesion. Sinuses/Orbits: No mass or acute finding within the imaged orbits. Partially imaged mucous retention cysts and/or polyps within the left maxillary sinus. Other: Right mastoid effusion. Right mastoid air cell sclerosis also noted. ASPECTS [facility] Stroke Program Early CT Score) - Ganglionic level infarction (caudate, lentiform nuclei, internal capsule, insula, M1-M3 cortex): 7 - Supraganglionic infarction (M4-M6 cortex): 3 Total score (0-10 with 10 being normal): 10 No evidence of an acute intracranial abnormality. These results were communicated to Dr. Selina Cooley at 10:55 amon 3/24/2025by text page via the [facility] messaging system. IMPRESSION: 1.  No evidence of an acute intracranial abnormality. 2. Expanded and partially empty sella turcica. This finding can reflect incidental anatomic variation, or alternatively, it can be  associated with chronic idiopathic intracranial hypertension (pseudotumor cerebri). 3. Generalized cerebral atrophy, mild but greater than expected for age. 4. Partially imaged mucous retention cysts and/or polyps within the left maxillary sinus. 5. Right mastoid effusion. Right mastoid air cell sclerosis also noted, suggesting sequela of chronic/recurrent mastoiditis. Electronically Signed   By: Jackey Loge D.O.   On: 04/06/2023 10:56      Subjective: Seen and examined at the day of discharge.  Stable no distress.  Prefer discharge home.  Discharge Exam: Vitals:   04/07/23 0416 04/07/23 0755  BP: (!) 105/58 (!) 94/43  Pulse: 89 85  Resp: 17 16  Temp: 98.1 F (36.7 C) (!) 97.5 F (36.4 C)  SpO2: 92% 90%   Vitals:   04/06/23 2135 04/06/23 2143 04/07/23 0416 04/07/23 0755  BP:  113/64 (!) 105/58 (!) 94/43  Pulse:  80 89 85  Resp:  15 17 16   Temp:  (!) 97.3 F (36.3 C) 98.1 F (36.7 C) (!) 97.5 F (36.4 C)  TempSrc:      SpO2:  100% 92% 90%  Weight: 72.7 kg     Height: 4\' 8"  (1.422 m)       General: Pt is alert, awake, not Barker acute distress Cardiovascular: RRR, S1/S2 +, no rubs, no gallops Respiratory: CTA bilaterally, no wheezing, no rhonchi Abdominal: Soft, NT, ND, bowel sounds + Extremities: no edema, no cyanosis    The results of significant diagnostics from this hospitalization (including imaging, microbiology, ancillary and laboratory) are listed below for reference.     Microbiology: Recent Results (from the past 240 hours)  Resp panel by RT-PCR (RSV, Flu A&B, Covid) Anterior Nasal Swab     Status: None   Collection Time: 04/06/23 12:07 PM   Specimen: Anterior  Nasal Swab  Result Value Ref Range Status   SARS Coronavirus 2 by RT PCR NEGATIVE NEGATIVE Final    Comment: (NOTE) SARS-CoV-2 target nucleic acids are NOT DETECTED.  The SARS-CoV-2 RNA is generally detectable Barker upper respiratory specimens during the acute phase of infection. The  lowest concentration of SARS-CoV-2 viral copies this assay can detect is 138 copies/mL. A negative result does not preclude SARS-Cov-2 infection and should not be used as the sole basis for treatment or other patient management decisions. A negative result may occur with  improper specimen collection/handling, submission of specimen other than nasopharyngeal swab, presence of viral mutation(s) within the areas targeted by this assay, and inadequate number of viral copies(<138 copies/mL). A negative result must be combined with clinical observations, patient history, and epidemiological information. The expected result is Negative.  Fact Sheet for Patients:  BloggerCourse.com  Fact Sheet for Healthcare Providers:  SeriousBroker.it  This test is no t yet approved or cleared by the [place] FDA and  has been authorized for detection and/or diagnosis of SARS-CoV-2 by FDA under an Emergency Use Authorization (EUA). This EUA will remain  Barker effect (meaning this test can be used) for the duration of the COVID-19 declaration under Section 564(b)(1) of the Act, 21 U.S.C.section 360bbb-3(b)(1), unless the authorization is terminated  or revoked sooner.       Influenza A by PCR NEGATIVE NEGATIVE Final   Influenza B by PCR NEGATIVE NEGATIVE Final    Comment: (NOTE) The Xpert Xpress SARS-CoV-2/FLU/RSV plus assay is intended as an aid Barker the diagnosis of influenza from Nasopharyngeal swab specimens and should not be used as a sole basis for treatment. Nasal washings and aspirates are unacceptable for Xpert Xpress SARS-CoV-2/FLU/RSV testing.  Fact Sheet for Patients: BloggerCourse.com  Fact Sheet for Healthcare Providers: SeriousBroker.it  This test is not yet approved or cleared by the [place] FDA and has been authorized for detection and/or diagnosis of SARS-CoV-2 by FDA under  an Emergency Use Authorization (EUA). This EUA will remain Barker effect (meaning this test can be used) for the duration of the COVID-19 declaration under Section 564(b)(1) of the Act, 21 U.S.C. section 360bbb-3(b)(1), unless the authorization is terminated or revoked.     Resp Syncytial Virus by PCR NEGATIVE NEGATIVE Final    Comment: (NOTE) Fact Sheet for Patients: BloggerCourse.com  Fact Sheet for Healthcare Providers: SeriousBroker.it  This test is not yet approved or cleared by the [place] FDA and has been authorized for detection and/or diagnosis of SARS-CoV-2 by FDA under an Emergency Use Authorization (EUA). This EUA will remain Barker effect (meaning this test can be used) for the duration of the COVID-19 declaration under Section 564(b)(1) of the Act, 21 U.S.C. section 360bbb-3(b)(1), unless the authorization is terminated or revoked.  Performed at [facility], [address] [place], [place] [address]      Labs: BNP (last 3 results) No results for input(s): "BNP" Barker the last 8760 hours. Basic Metabolic Panel: Recent Labs  Lab 04/06/23 1045  NA 139  K 3.6  CL 105  CO2 27  GLUCOSE 154*  BUN 15  CREATININE 1.30*  CALCIUM 8.6*   Liver Function Tests: Recent Labs  Lab 04/06/23 1045  AST 38  ALT 20  ALKPHOS 75  BILITOT 0.8  PROT 7.5  ALBUMIN 2.9*   No results for input(s): "LIPASE", "AMYLASE" Barker the last 168 hours. No results for input(s): "AMMONIA" Barker the last 168 hours. CBC: Recent Labs  Lab 04/06/23  1045  WBC 4.0  NEUTROABS 2.6  HGB 15.1*  HCT 43.9  MCV 90.7  PLT 95*   Cardiac Enzymes: No results for input(s): "CKTOTAL", "CKMB", "CKMBINDEX", "TROPONINI" Barker the last 168 hours. BNP: Invalid input(s): "POCBNP" CBG: Recent Labs  Lab 04/06/23 1028  GLUCAP 123*   D-Dimer No results for input(s): "DDIMER" Barker the last 72 hours. Hgb A1c No results for input(s): "HGBA1C" Barker the  last 72 hours. Lipid Profile No results for input(s): "CHOL", "HDL", "LDLCALC", "TRIG", "CHOLHDL", "LDLDIRECT" Barker the last 72 hours. Thyroid function studies Recent Labs    04/06/23 1045  TSH 7.665*   Anemia work up No results for input(s): "VITAMINB12", "FOLATE", "FERRITIN", "TIBC", "IRON", "RETICCTPCT" Barker the last 72 hours. Urinalysis    Component Value Date/Time   COLORURINE YELLOW (A) 04/06/2023 1207   APPEARANCEUR CLEAR (A) 04/06/2023 1207   LABSPEC >1.046 (H) 04/06/2023 1207   PHURINE 6.0 04/06/2023 1207   GLUCOSEU NEGATIVE 04/06/2023 1207   HGBUR MODERATE (A) 04/06/2023 1207   BILIRUBINUR NEGATIVE 04/06/2023 1207   KETONESUR NEGATIVE 04/06/2023 1207   PROTEINUR NEGATIVE 04/06/2023 1207   NITRITE NEGATIVE 04/06/2023 1207   LEUKOCYTESUR SMALL (A) 04/06/2023 1207   Sepsis Labs Recent Labs  Lab 04/06/23 1045  WBC 4.0   Microbiology Recent Results (from the past 240 hours)  Resp panel by RT-PCR (RSV, Flu A&B, Covid) Anterior Nasal Swab     Status: None   Collection Time: 04/06/23 12:07 PM   Specimen: Anterior Nasal Swab  Result Value Ref Range Status   SARS Coronavirus 2 by RT PCR NEGATIVE NEGATIVE Final    Comment: (NOTE) SARS-CoV-2 target nucleic acids are NOT DETECTED.  The SARS-CoV-2 RNA is generally detectable Barker upper respiratory specimens during the acute phase of infection. The lowest concentration of SARS-CoV-2 viral copies this assay can detect is 138 copies/mL. A negative result does not preclude SARS-Cov-2 infection and should not be used as the sole basis for treatment or other patient management decisions. A negative result may occur with  improper specimen collection/handling, submission of specimen other than nasopharyngeal swab, presence of viral mutation(s) within the areas targeted by this assay, and inadequate number of viral copies(<138 copies/mL). A negative result must be combined with clinical observations, patient history, and  epidemiological information. The expected result is Negative.  Fact Sheet for Patients:  BloggerCourse.com  Fact Sheet for Healthcare Providers:  SeriousBroker.it  This test is no t yet approved or cleared by the [place] FDA and  has been authorized for detection and/or diagnosis of SARS-CoV-2 by FDA under an Emergency Use Authorization (EUA). This EUA will remain  Barker effect (meaning this test can be used) for the duration of the COVID-19 declaration under Section 564(b)(1) of the Act, 21 U.S.C.section 360bbb-3(b)(1), unless the authorization is terminated  or revoked sooner.       Influenza A by PCR NEGATIVE NEGATIVE Final   Influenza B by PCR NEGATIVE NEGATIVE Final    Comment: (NOTE) The Xpert Xpress SARS-CoV-2/FLU/RSV plus assay is intended as an aid Barker the diagnosis of influenza from Nasopharyngeal swab specimens and should not be used as a sole basis for treatment. Nasal washings and aspirates are unacceptable for Xpert Xpress SARS-CoV-2/FLU/RSV testing.  Fact Sheet for Patients: BloggerCourse.com  Fact Sheet for Healthcare Providers: SeriousBroker.it  This test is not yet approved or cleared by the [place] FDA and has been authorized for detection and/or diagnosis of SARS-CoV-2 by FDA under an Emergency Use Authorization (EUA). This EUA  will remain Barker effect (meaning this test can be used) for the duration of the COVID-19 declaration under Section 564(b)(1) of the Act, 21 U.S.C. section 360bbb-3(b)(1), unless the authorization is terminated or revoked.     Resp Syncytial Virus by PCR NEGATIVE NEGATIVE Final    Comment: (NOTE) Fact Sheet for Patients: BloggerCourse.com  Fact Sheet for Healthcare Providers: SeriousBroker.it  This test is not yet approved or cleared by the [place] FDA and has been  authorized for detection and/or diagnosis of SARS-CoV-2 by FDA under an Emergency Use Authorization (EUA). This EUA will remain Barker effect (meaning this test can be used) for the duration of the COVID-19 declaration under Section 564(b)(1) of the Act, 21 U.S.C. section 360bbb-3(b)(1), unless the authorization is terminated or revoked.  Performed at [facility], [address], [place], [place] [address]      Time coordinating discharge: Over 30 minutes  SIGNED:   Tresa Moore, MD  [facility] 04/07/2023, 2:25 PM Pager   If 7PM-7AM, please contact night-coverage

## 2023-04-07 NOTE — Progress Notes (Signed)
 Eeg done

## 2023-04-07 NOTE — Procedures (Signed)
 Routine EEG Report  Sarah Barker is a 43 y.o. female with a history of altered mental status who is undergoing an EEG to evaluate for seizures.  Report: This EEG was acquired with electrodes placed according to the International 10-20 electrode system (including Fp1, Fp2, F3, F4, C3, C4, P3, P4, O1, O2, T3, T4, T5, T6, A1, A2, Fz, Cz, Pz). The following electrodes were missing or displaced: none.  The occipital dominant rhythm was 7 Hz. This activity is reactive to stimulation. Drowsiness was manifested by background fragmentation; deeper stages of sleep were identified by K complexes and sleep spindles. There was no focal slowing. There were no interictal epileptiform discharges. There were no electrographic seizures identified. There was no abnormal response to photic stimulation or hyperventilation.   Impression and clinical correlation: This EEG was obtained while awake and asleep and is abnormal due to mild diffuse slowing indicative of global cerebral dysfunction. Epileptiform abnormalities were not seen during this recording.  Bing Neighbors, MD [facility] [phone]  If 7pm- 7am, please page neurology on call as listed in [facility]ION.

## 2023-04-07 NOTE — Evaluation (Signed)
 Clinical/Bedside Swallow Evaluation Patient Details  Name: Sarah Barker MRN: [id number] Date of Birth: 1980/02/11  Today's Date: 04/07/2023 Time: SLP Start Time (ACUTE ONLY): 1010 SLP Stop Time (ACUTE ONLY): 1030 SLP Time Calculation (min) (ACUTE ONLY): 20 min  Past Medical History:  Past Medical History:  Diagnosis Date   CKD (chronic kidney disease)    Down syndrome    Elevated hemoglobin (HCC)    Erythrocytosis    Erythrocytosis    Granulomatous uveitis    Hirsutism    Hypothyroid    Stage 3b chronic kidney disease (CKD) (HCC)    Past Surgical History:  Past Surgical History:  Procedure Laterality Date   ATRIOVENTRICULAR CANAL REPAIR, COMPLETE     CARDIAC SURGERY     HPI:  Per H&P "Sarah Barker is a 43 y.o. female with medical history significant of Down syndrome, brought in by family member for duration of acute altered mentation.     Patient has altered mentation unable to provide any history, all history provided by mother at bedside.  Symptoms happened this morning, mother in the next room and heard the patient choking and gagging and went to see the patient and found patient staring forward unresponsive with eyes wide open.  Few minutes later able to talk but only mumbling undiscernible.  Denied any loss control of urine and bowel movement during the episode no LOC.  Mother watched the patient for few hours and decided to bring her in after no significant improvement of her mentations.  No fall." MRI, 04/06/23, "No abnormal enhancement of the nodular region of the ventral medulla  oblongata. Brainstem hemorrhage Sarah Barker remains the primary  differential consideration. Consider follow-up MRI with and without  contrast in 8-12 weeks."    Assessment / Plan / Recommendation  Clinical Impression  Pt seen for clinical swallowing evaluation. Pt demonstrated a grossly intact oral swallow c/b mildly prolonged, but functional, mastication of solids and trace residual with solids  which cleared with liquids wash. Pharyngeal swallow was judged to be [facility]. Habitual secondary swallow as confirmed by mother were observed. Recommend initiation of a regular diet with thin liquids with safe swallowing strategies/aspiration precautions as outlined below. Per mother, pt at swallowing baseline. SLP to sign off as pt has no acute ST needs. SLP Visit Diagnosis: Dysphagia, unspecified (R13.10)    Aspiration Risk  Mild aspiration risk    Diet Recommendation Regular;Thin liquid    Liquid Administration via: Spoon;Cup;Straw Medication Administration:  (as tolerated) Supervision: Patient able to self feed;Full supervision/cueing for compensatory strategies (set up) Compensations: Minimize environmental distractions;Slow rate;Small sips/bites;Follow solids with liquid Postural Changes: Seated upright at 90 degrees;Remain upright for at least 30 minutes after po intake    Other  Recommendations Oral Care Recommendations: Oral care QID    Recommendations for follow up therapy are one component of a multi-disciplinary discharge planning process, led by the attending physician.  Recommendations may be updated based on patient status, additional functional criteria and insurance authorization.  Follow up Recommendations No SLP follow up         Functional Status Assessment Patient has not had a recent decline in their functional status         Prognosis Prognosis for improved oropharyngeal function: Good Barriers to Reach Goals: Cognitive deficits      Swallow Study   General Date of Onset: 04/06/23 HPI: Per H&P "Sarah Barker is a 43 y.o. female with medical history significant of Down syndrome, brought in by family member for duration of  acute altered mentation.     Patient has altered mentation unable to provide any history, all history provided by mother at bedside.  Symptoms happened this morning, mother in the next room and heard the patient choking and gagging and went to  see the patient and found patient staring forward unresponsive with eyes wide open.  Few minutes later able to talk but only mumbling undiscernible.  Denied any loss control of urine and bowel movement during the episode no LOC.  Mother watched the patient for few hours and decided to bring her in after no significant improvement of her mentations.  No fall." MRI, 04/06/23, "No abnormal enhancement of the nodular region of the ventral medulla  oblongata. Brainstem hemorrhage Sarah Barker remains the primary  differential consideration. Consider follow-up MRI with and without  contrast in 8-12 weeks." Type of Study: Bedside Swallow Evaluation Previous Swallow Assessment: none Diet Prior to this Study: NPO Temperature Spikes Noted: No Respiratory Status: Room air Behavior/Cognition: Alert;Cooperative;Pleasant mood;Requires cueing Oral Cavity Assessment: Dry Oral Care Completed by SLP: Recent completion by staff Oral Cavity - Dentition: Poor condition;Missing dentition Vision: Functional for self-feeding Self-Feeding Abilities: Able to feed self;Needs set up Patient Positioning: Upright in bed Baseline Vocal Quality: Normal Volitional Cough: Cognitively unable to elicit Volitional Swallow: Unable to elicit    Oral/Motor/Sensory Function Overall Oral Motor/Sensory Function: Within functional limits (no functional deficits)   Ice Chips Ice chips: Not tested   Thin Liquid Thin Liquid: Within functional limits Presentation: Straw    Nectar Thick Nectar Thick Liquid: Not tested   Honey Thick Honey Thick Liquid: Not tested   Puree Puree: Within functional limits Presentation: Self Fed;Spoon   Solid     Solid: Within functional limits (mildly prolonged, but functional, mastication; trace oral residual) Presentation: Self Fed     Sarah Barker, M.S., Sarah Barker Speech-Language Pathologist [facility] [phone] (ASCOM)  Sarah Barker Soyla Bainter 04/07/2023,11:39  AM

## 2023-04-07 NOTE — Plan of Care (Signed)
  Problem: Education: Goal: Knowledge of General Education information will improve Description: Including pain rating scale, medication(s)/side effects and non-pharmacologic comfort measures Outcome: Progressing   Problem: Elimination: Goal: Will not experience complications related to bowel motility Outcome: Progressing Goal: Will not experience complications related to urinary retention Outcome: Progressing   Problem: Skin Integrity: Goal: Risk for impaired skin integrity will decrease Outcome: Progressing   Problem: Education: Goal: Expressions of having a comfortable level of knowledge regarding the disease process will increase Outcome: Progressing   Problem: Coping: Goal: Ability to adjust to condition or change in health will improve Outcome: Progressing   Problem: Safety: Goal: Verbalization of understanding the information provided will improve Outcome: Progressing

## 2023-04-07 NOTE — Progress Notes (Signed)
 Per Dr Georgeann Oppenheim, dc [facility] order

## 2024-01-03 IMAGING — US US RENAL
1 series · 14 of 25 positions shown · non-contrast
Comparison: None.

CLINICAL DATA: Stage III B chronic kidney disease

EXAM:
RENAL / URINARY TRACT ULTRASOUND COMPLETE

[Series 1: us renal · 14 of 79 slices shown]
[im 1/79]
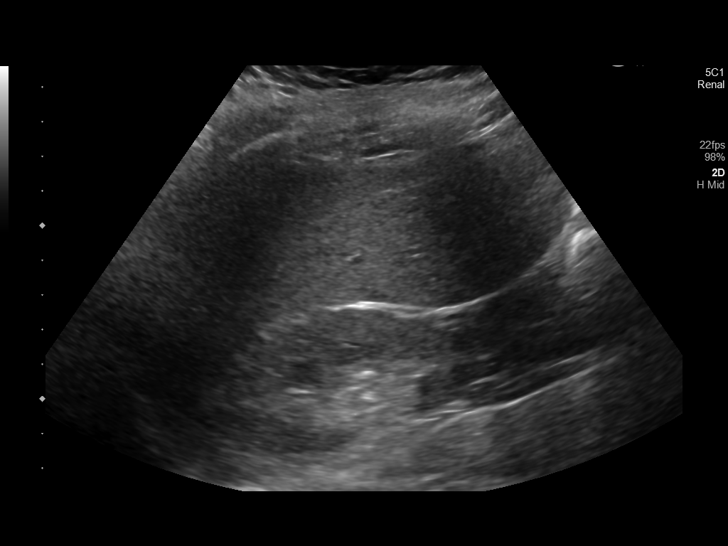
[im 7/79]
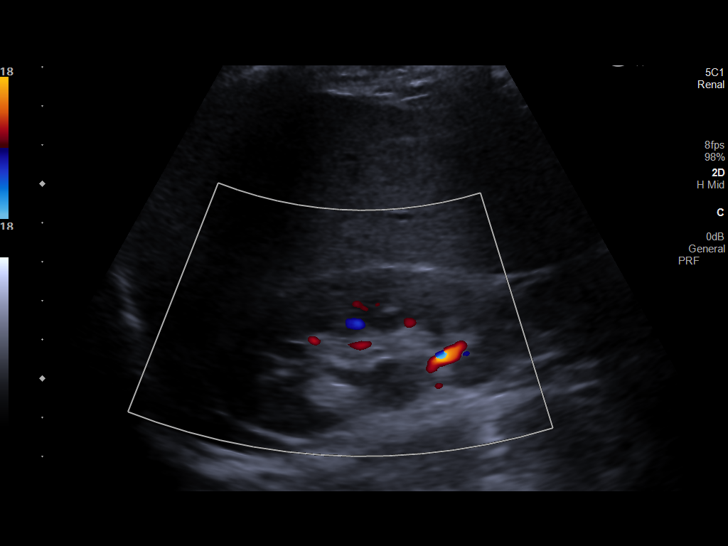
[im 14/79]
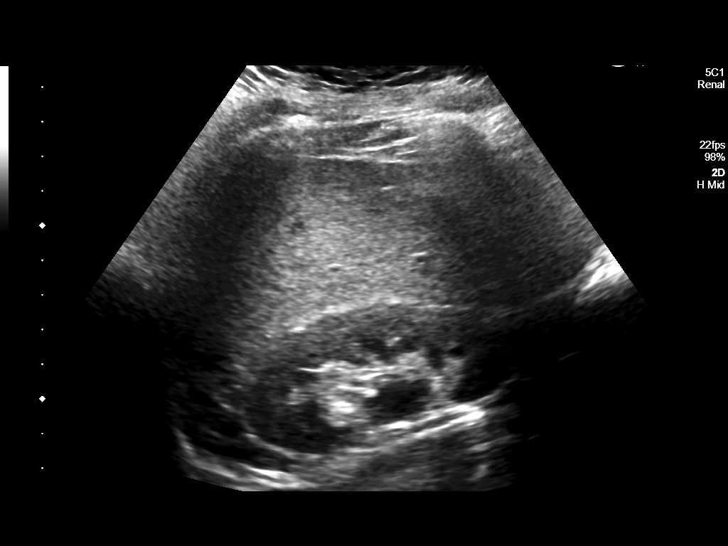
[im 20/79]
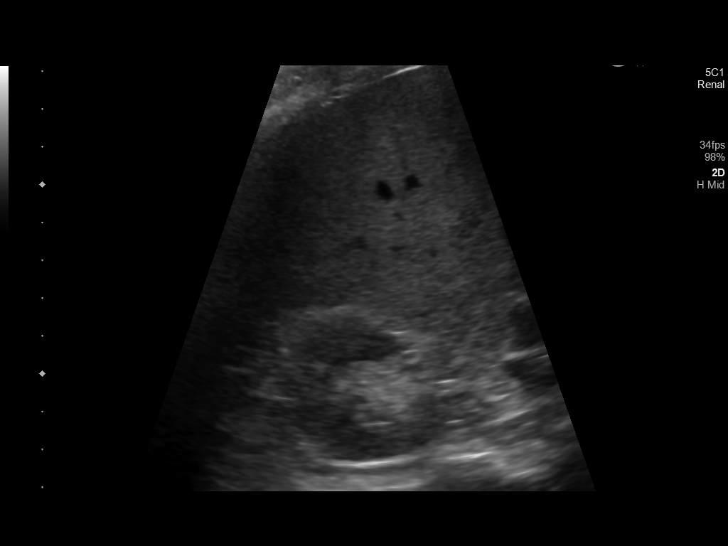
[im 27/79]
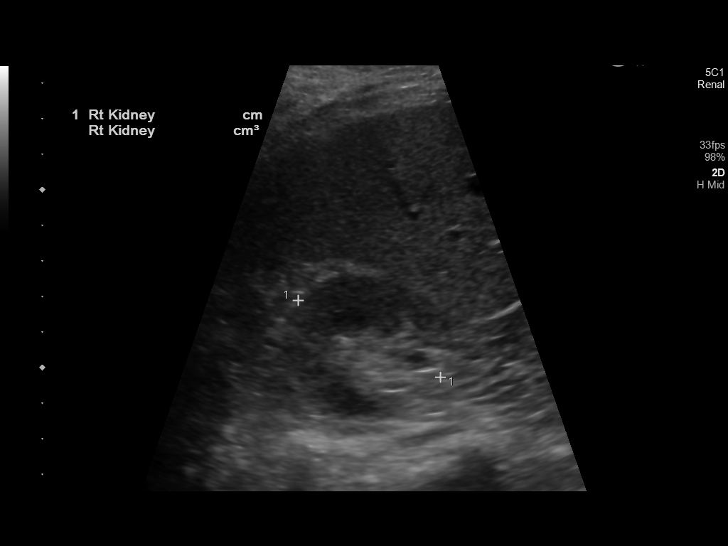
[im 30/79]
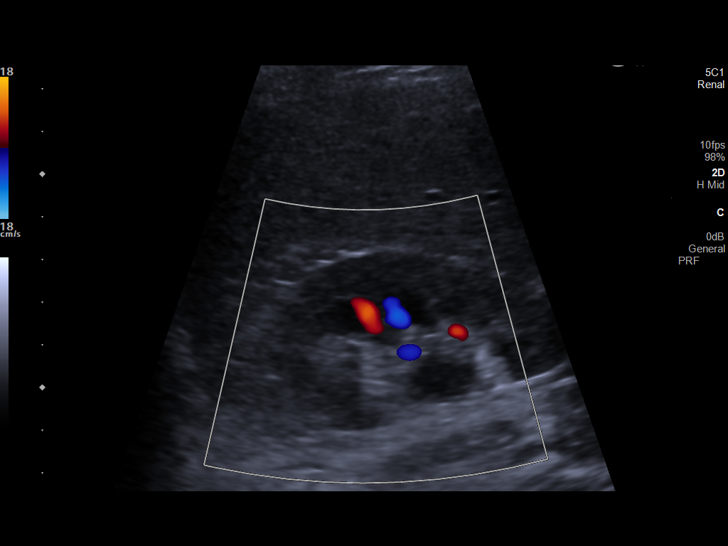
[im 36/79]
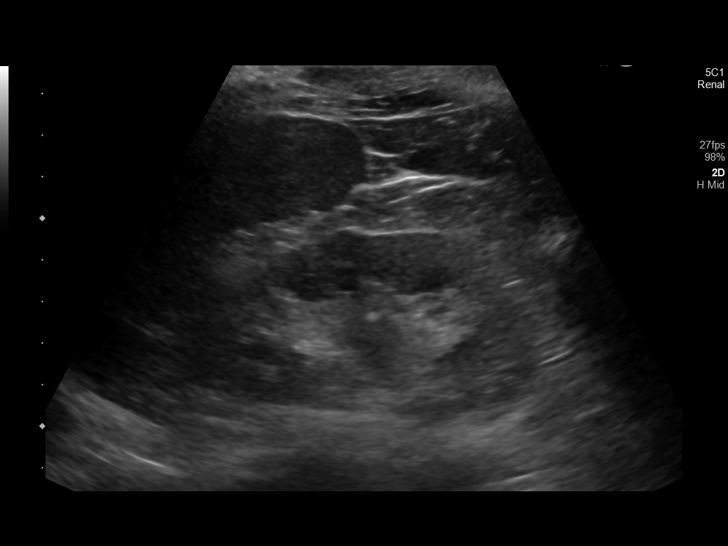
[im 43/79]
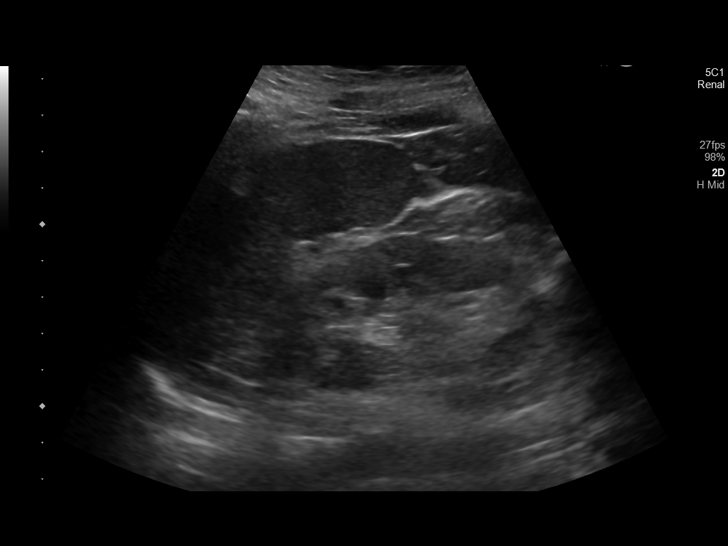
[im 49/79]
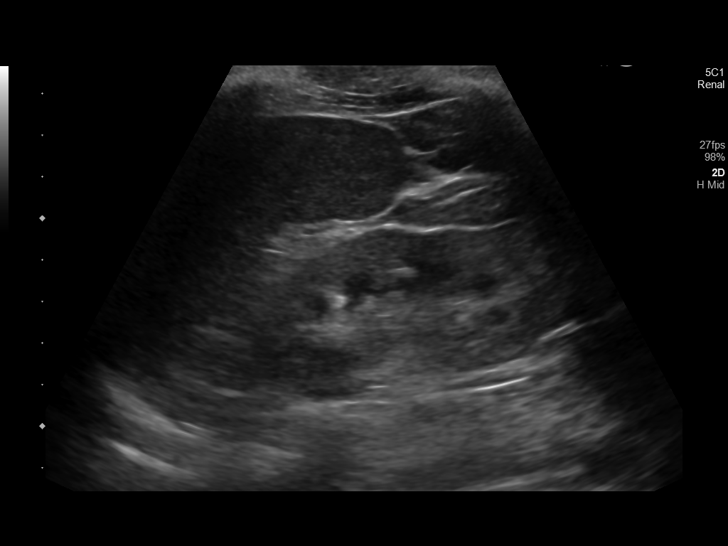
[im 53/79]
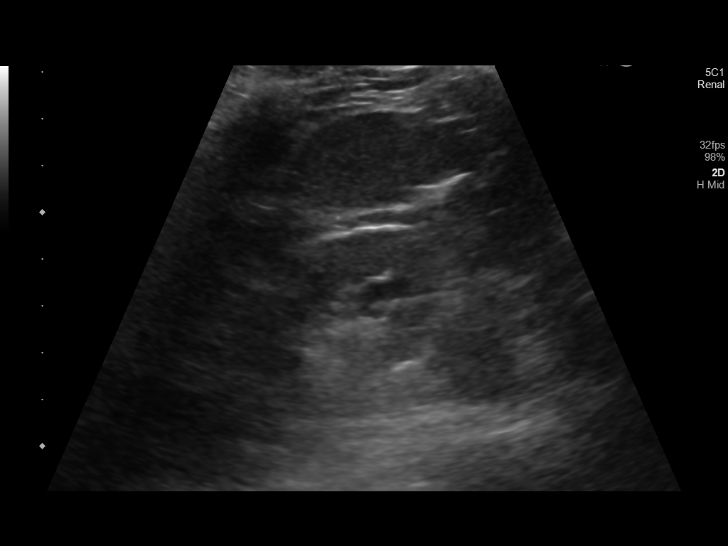
[im 59/79]
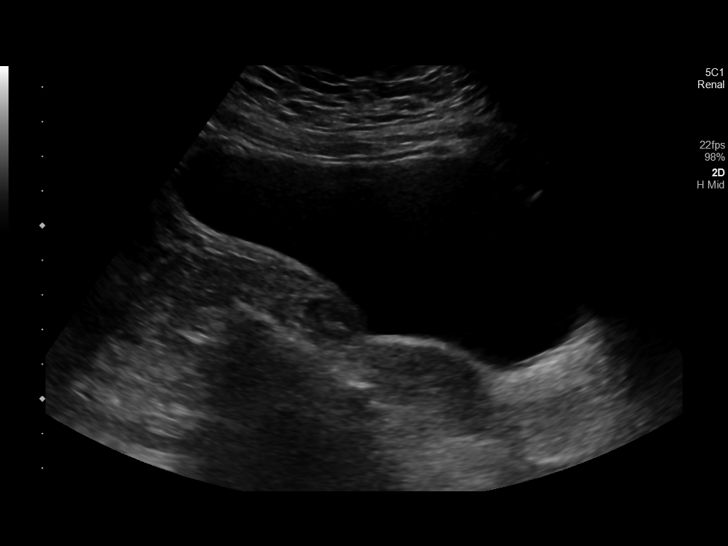
[im 66/79]
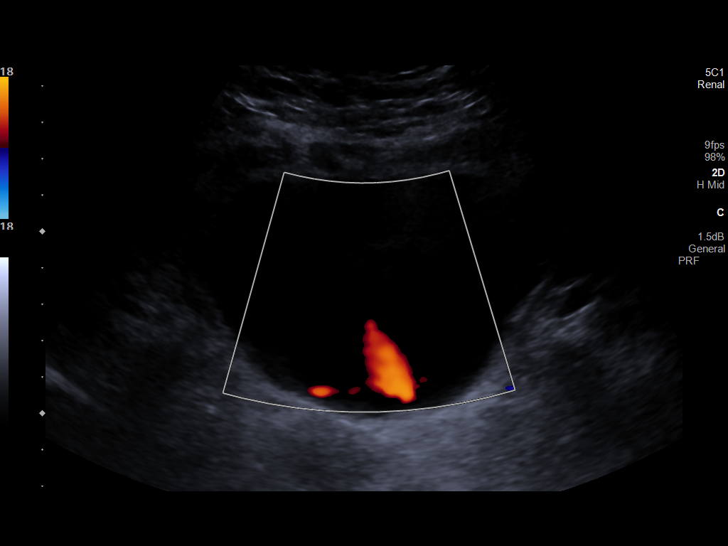
[im 72/79]
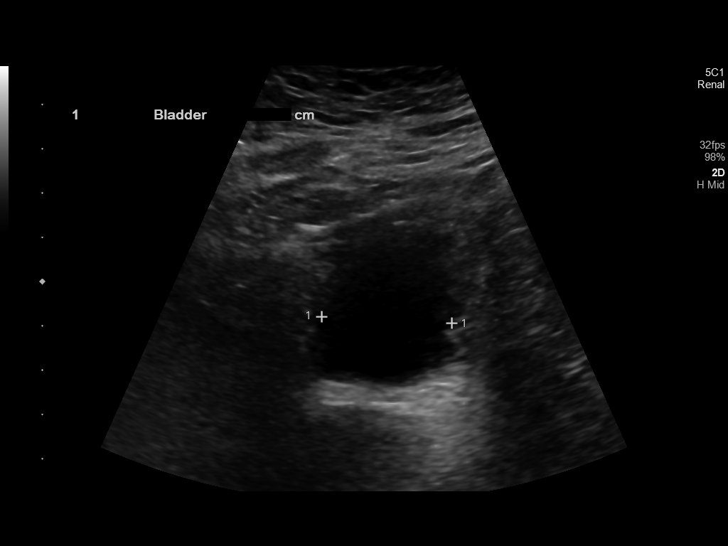
[im 79/79]
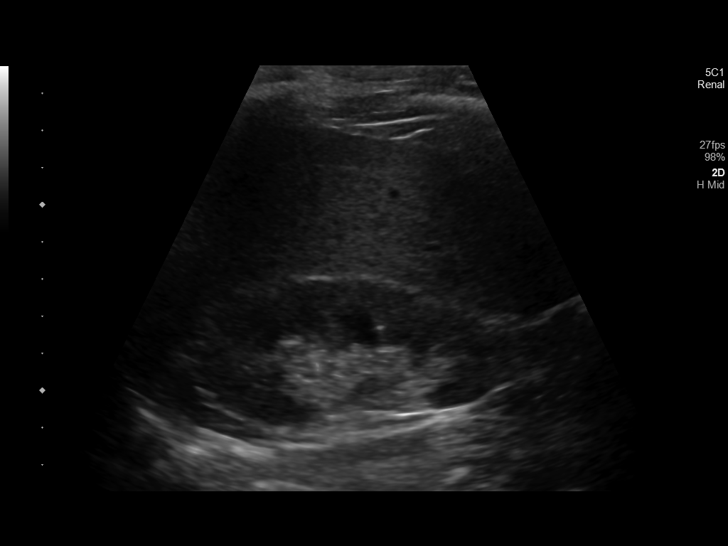

[14 of 25 positions shown; findings below may reference images not displayed]

FINDINGS: Right Kidney:

Renal measurements: 7.4 x 4.1 x 4.5 cm = volume: 71 mL. Mild
pelviectasis, resolved after voiding. No hydronephrosis. Normal
echotexture. No mass.

Left Kidney:

Renal measurements: 8.6 x 4.7 x 3.4 cm = volume: 73 mL. Echogenicity
within normal limits. No mass or hydronephrosis visualized.

Bladder:

Appears normal for degree of bladder distention.

Other:

None.
IMPRESSION: No acute findings.  No hydronephrosis.

## 2024-02-27 IMAGING — CT CT CHEST W/O CM
2 of 4 series · 15 of 36 positions shown, 18 images · non-contrast
Comparison: None.

CLINICAL DATA: Shortness of breath



[Series 2: thorax · axial · 0.62mm/px · z∈[-808,-588]mm · 12 of 132 slices shown, 15 images]
[im 11/132  mediastinal]
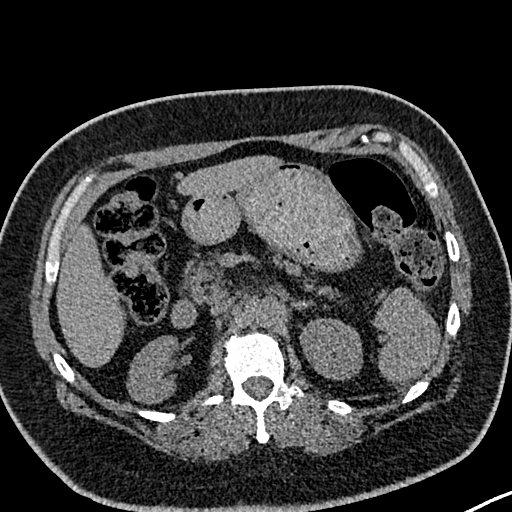
[im 11/132  lung]
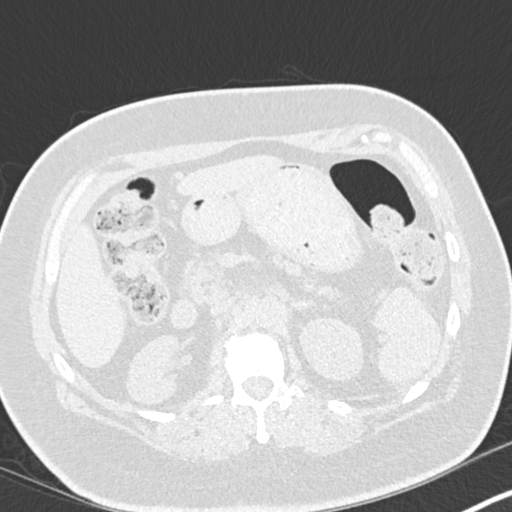
[im 21/132  lung]
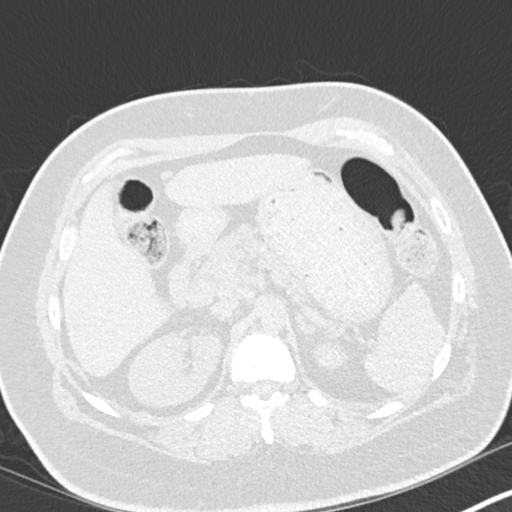
[im 31/132  lung]
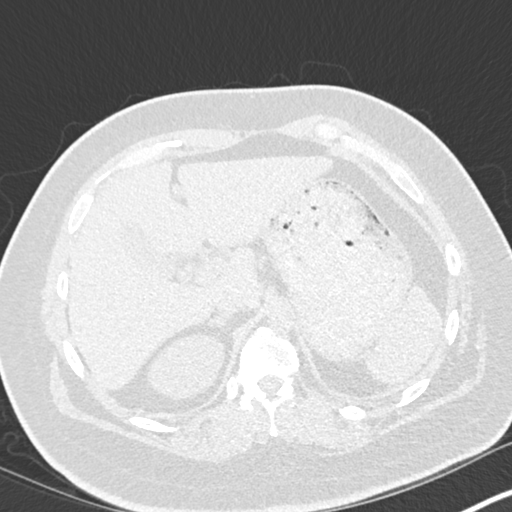
[im 41/132  lung]
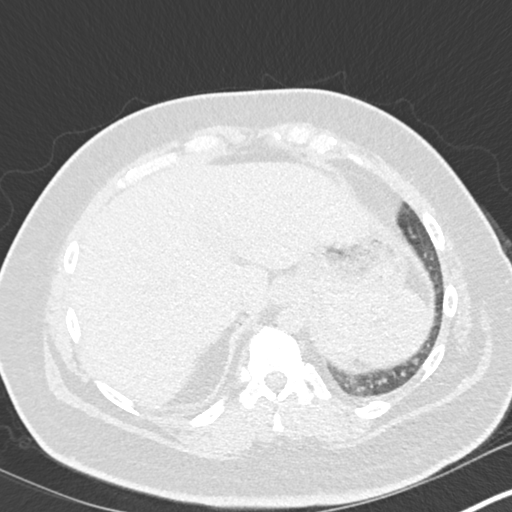
[im 51/132  mediastinal]
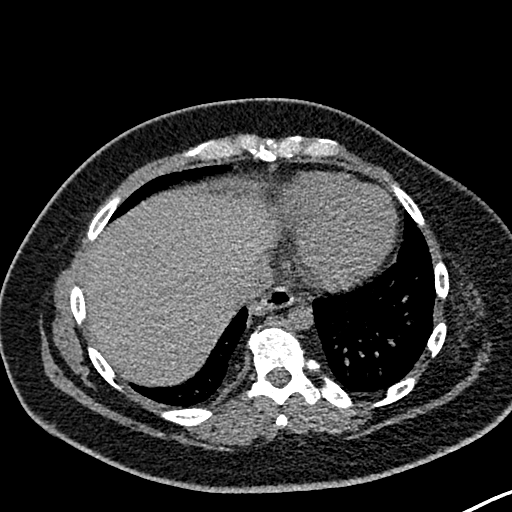
[im 51/132  lung]
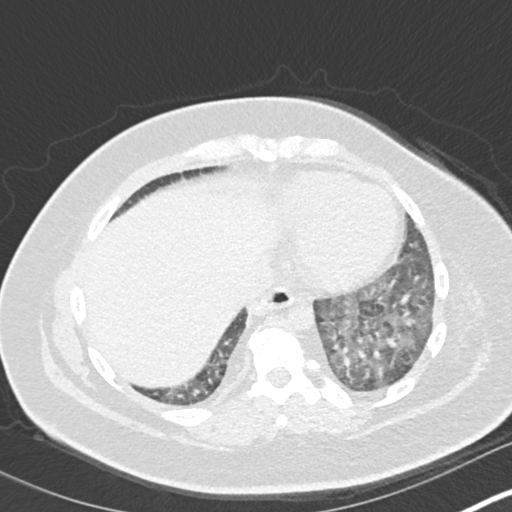
[im 61/132  lung]
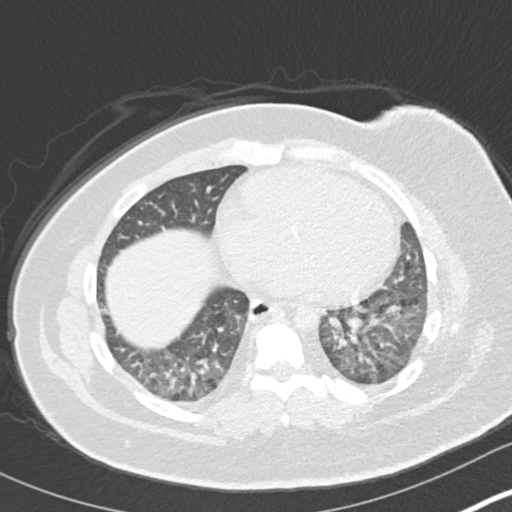
[im 71/132  lung]
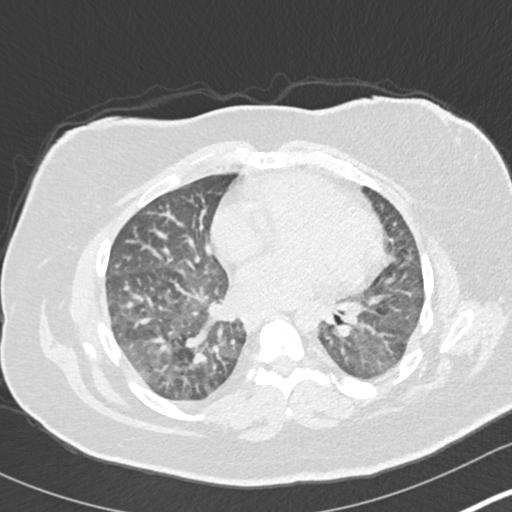
[im 81/132  lung]
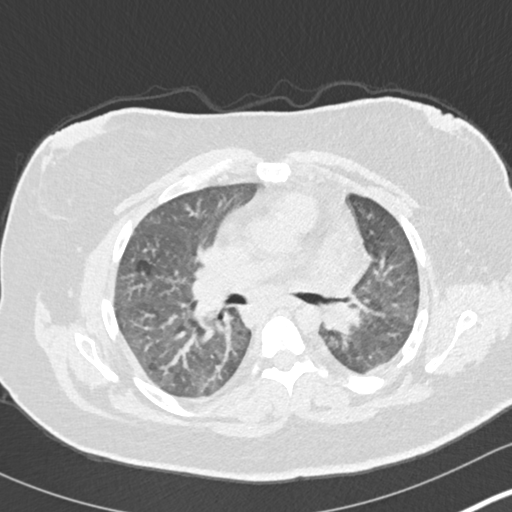
[im 91/132  mediastinal]
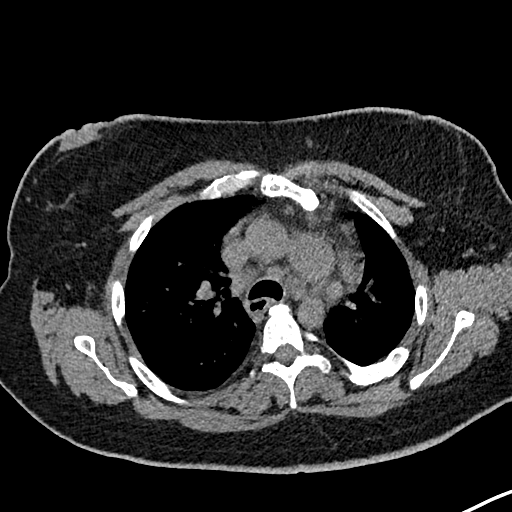
[im 91/132  lung]
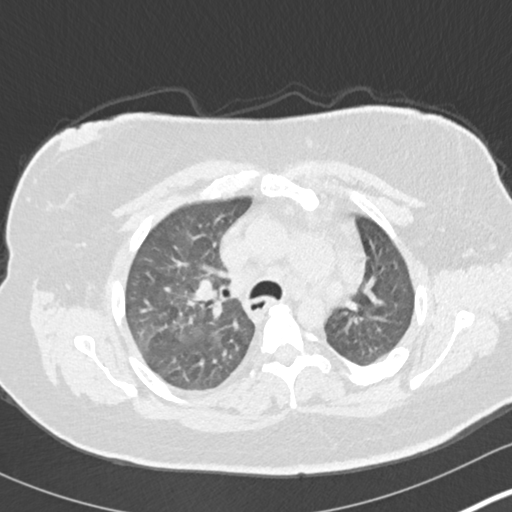
[im 101/132  lung]
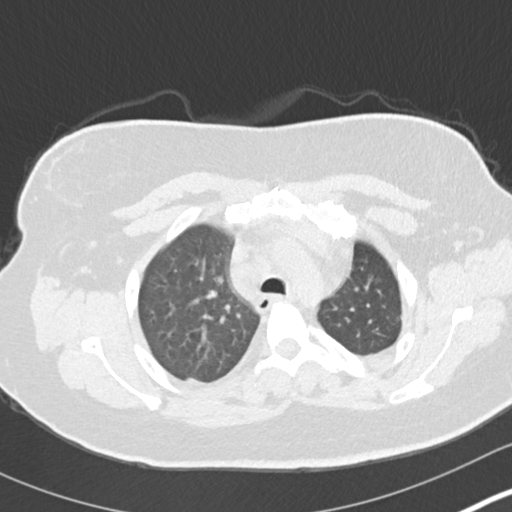
[im 111/132  lung]
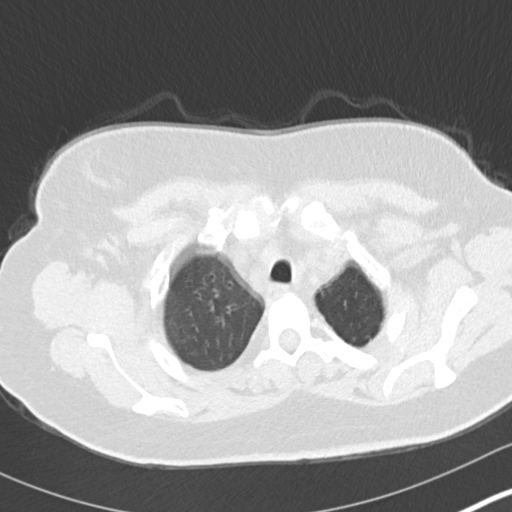
[im 121/132  lung]
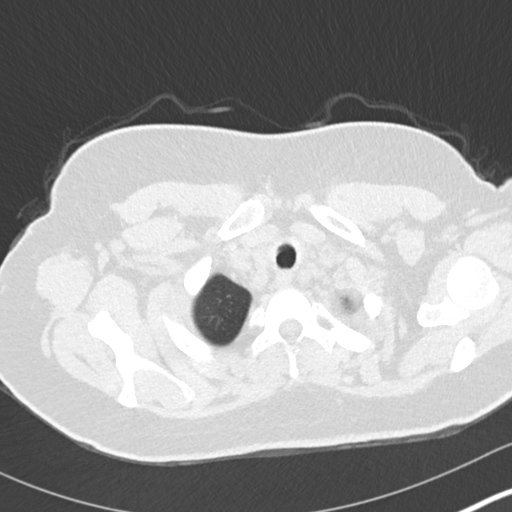

[Series 5: coronal · coronal · 0.58mm/px · 3 of 113 slices shown]
[im 23/113  lung]
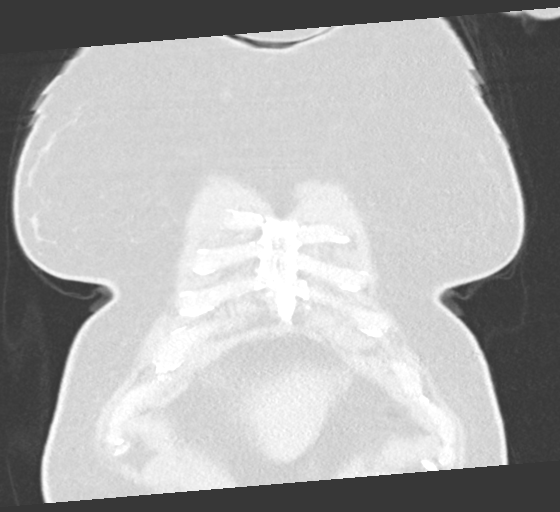
[im 45/113  lung]
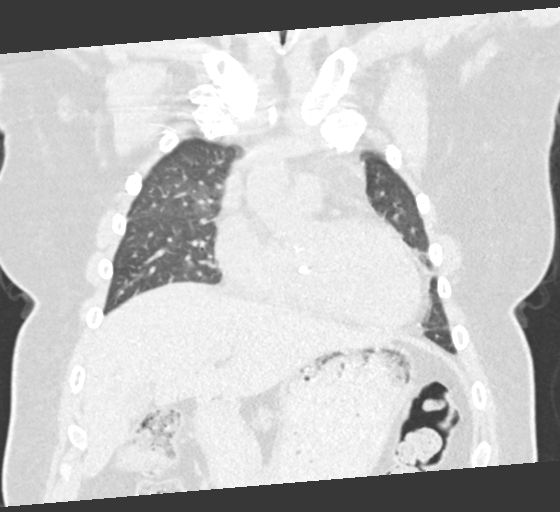
[im 68/113  lung]
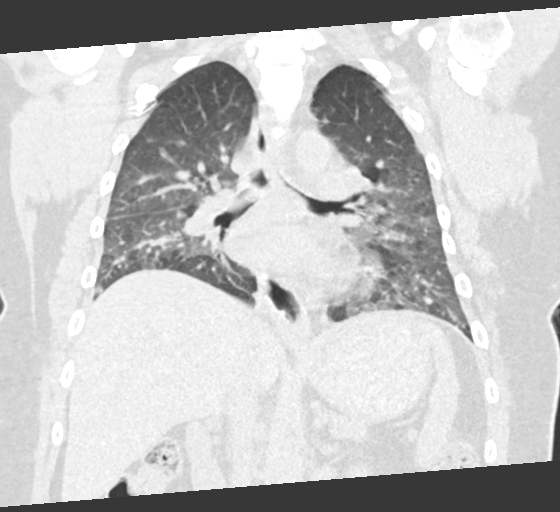

[15 of 36 positions shown; findings below may reference images not displayed]

FINDINGS: Cardiovascular: Normal heart size. No pericardial effusion. Normal
caliber thoracic aorta with no evidence of atherosclerotic disease.
Hyperdense linear material seen at the area of the intraventricular
septum, likely due to prior AV canal repair.

Mediastinum/Nodes: Esophagus is unremarkable. Thyroid is
unremarkable. Enlarged mediastinal and hilar lymph nodes reference
AP window lymph node measuring 1.0 cm in short axis on series 2,
image 41.

Lungs/Pleura: Central airways are patent. Bilateral mosaic
attenuation. Bilateral ground-glass opacities are likely due to
atelectasis given expiratory phase of imaging. No consolidation,
pleural effusion or pneumothorax.

Upper Abdomen: No acute abnormality.

Musculoskeletal: Prior median sternotomy with intact sternal wires.
No chest wall mass or suspicious bone lesions identified.
IMPRESSION: 1. Bilateral mosaic attenuation, likely due to air trapping which
can be seen in the setting of small airways disease.
2. Bilateral ground-glass opacities are likely due to atelectasis
given expiratory phase of imaging.
3. Mildly enlarged mediastinal and hilar lymph nodes, likely
reactive.
